# Patient Record
Sex: Male | Born: 1965 | Race: Black or African American | Hispanic: No | Marital: Married | Smoking: Current every day smoker
Health system: Southern US, Community
[De-identification: ages and names within clinical notes are randomized; demographics above are authoritative.]

## PROBLEM LIST (undated history)

## (undated) DIAGNOSIS — M609 Myositis, unspecified: Secondary | ICD-10-CM

## (undated) DIAGNOSIS — M1990 Unspecified osteoarthritis, unspecified site: Secondary | ICD-10-CM

## (undated) DIAGNOSIS — M25552 Pain in left hip: Secondary | ICD-10-CM

## (undated) DIAGNOSIS — J302 Other seasonal allergic rhinitis: Secondary | ICD-10-CM

## (undated) DIAGNOSIS — S29012A Strain of muscle and tendon of back wall of thorax, initial encounter: Secondary | ICD-10-CM

## (undated) DIAGNOSIS — L0390 Cellulitis, unspecified: Secondary | ICD-10-CM

## (undated) DIAGNOSIS — L309 Dermatitis, unspecified: Secondary | ICD-10-CM

## (undated) DIAGNOSIS — M549 Dorsalgia, unspecified: Secondary | ICD-10-CM

## (undated) HISTORY — PX: MULTIPLE TOOTH EXTRACTIONS: SHX2053

## (undated) HISTORY — PX: HIP ASPIRATION LEFT (ARMC HX): HXRAD1711

---

## 2005-01-06 ENCOUNTER — Emergency Department: Payer: Self-pay | Admitting: Emergency Medicine

## 2006-01-12 ENCOUNTER — Emergency Department: Payer: Self-pay | Admitting: Unknown Physician Specialty

## 2006-03-31 ENCOUNTER — Emergency Department: Payer: Self-pay | Admitting: Emergency Medicine

## 2006-03-31 ENCOUNTER — Other Ambulatory Visit: Payer: Self-pay

## 2007-03-16 ENCOUNTER — Emergency Department: Payer: Self-pay | Admitting: Emergency Medicine

## 2008-05-08 ENCOUNTER — Other Ambulatory Visit: Payer: Self-pay

## 2008-05-08 ENCOUNTER — Emergency Department: Payer: Self-pay | Admitting: Emergency Medicine

## 2008-07-30 ENCOUNTER — Emergency Department: Payer: Self-pay | Admitting: Emergency Medicine

## 2010-11-05 ENCOUNTER — Emergency Department: Payer: Self-pay | Admitting: Emergency Medicine

## 2010-11-07 ENCOUNTER — Emergency Department: Payer: Self-pay | Admitting: Emergency Medicine

## 2010-11-14 ENCOUNTER — Ambulatory Visit (HOSPITAL_COMMUNITY)
Admission: RE | Admit: 2010-11-14 | Discharge: 2010-11-14 | Payer: Self-pay | Source: Home / Self Care | Attending: Chiropractic Medicine | Admitting: Chiropractic Medicine

## 2011-10-22 ENCOUNTER — Emergency Department: Payer: Self-pay | Admitting: *Deleted

## 2013-05-11 ENCOUNTER — Emergency Department: Payer: Self-pay | Admitting: Unknown Physician Specialty

## 2014-08-31 ENCOUNTER — Emergency Department: Payer: Self-pay | Admitting: Emergency Medicine

## 2015-02-13 ENCOUNTER — Emergency Department
Admit: 2015-02-13 | Disposition: A | Discharge status: Home / Self Care | Payer: Self-pay | Admitting: Emergency Medicine

## 2015-05-30 ENCOUNTER — Emergency Department
Admission: EM | Admit: 2015-05-30 | Discharge: 2015-05-30 | Disposition: A | Discharge status: Home / Self Care | Payer: Self-pay | Attending: Emergency Medicine | Admitting: Emergency Medicine

## 2015-05-30 ENCOUNTER — Other Ambulatory Visit: Payer: Self-pay

## 2015-05-30 DIAGNOSIS — R531 Weakness: Secondary | ICD-10-CM

## 2015-05-30 DIAGNOSIS — Y9389 Activity, other specified: Secondary | ICD-10-CM | POA: Insufficient documentation

## 2015-05-30 DIAGNOSIS — Z720 Tobacco use: Secondary | ICD-10-CM | POA: Insufficient documentation

## 2015-05-30 DIAGNOSIS — Y990 Civilian activity done for income or pay: Secondary | ICD-10-CM | POA: Insufficient documentation

## 2015-05-30 DIAGNOSIS — M6282 Rhabdomyolysis: Secondary | ICD-10-CM | POA: Insufficient documentation

## 2015-05-30 DIAGNOSIS — W92XXXA Exposure to excessive heat of man-made origin, initial encounter: Secondary | ICD-10-CM | POA: Insufficient documentation

## 2015-05-30 DIAGNOSIS — Y9289 Other specified places as the place of occurrence of the external cause: Secondary | ICD-10-CM | POA: Insufficient documentation

## 2015-05-30 LAB — BASIC METABOLIC PANEL
ANION GAP: 9 (ref 5–15)
BUN: 28 mg/dL — ABNORMAL HIGH (ref 6–20)
CO2: 24 mmol/L (ref 22–32)
CREATININE: 1.11 mg/dL (ref 0.61–1.24)
Calcium: 9.4 mg/dL (ref 8.9–10.3)
Chloride: 103 mmol/L (ref 101–111)
GFR calc Af Amer: >60 mL/min (ref >60)
Glucose, Bld: 84 mg/dL (ref 65–99)
POTASSIUM: 4.6 mmol/L (ref 3.5–5.1)
SODIUM: 136 mmol/L (ref 135–145)

## 2015-05-30 LAB — CBC
HCT: 44 % (ref 40.0–52.0)
HEMOGLOBIN: 14.6 g/dL (ref 13.0–18.0)
MCH: 30.3 pg (ref 26.0–34.0)
MCHC: 33.2 g/dL (ref 32.0–36.0)
MCV: 91.5 fL (ref 80.0–100.0)
Platelets: 282 10*3/uL (ref 150–440)
RBC: 4.81 MIL/uL (ref 4.40–5.90)
RDW: 13.6 % (ref 11.5–14.5)
WBC: 6.9 10*3/uL (ref 3.8–10.6)

## 2015-05-30 LAB — CK: CK TOTAL: 476 U/L — AB (ref 49–397)

## 2015-05-30 LAB — TROPONIN I

## 2015-05-30 MED ORDER — SODIUM CHLORIDE 0.9 % IV BOLUS (SEPSIS)
1000.0000 mL | Freq: Once | INTRAVENOUS | Status: AC
  Administered 2015-05-30: 1000 mL via INTRAVENOUS

## 2015-05-30 NOTE — Discharge Instructions (Signed)
As we have discussed your labs are most consistent with dehydration, and rhabdomyolysis (muscle breakdown). It is very important he drink plenty of fluids over the next several days, rest and do not overexert herself. Follow-up with your primary care doctor in 1-2 days for recheck. Return to the emergency department for any increased weakness, fatigue, decreased urine output, or any other symptom personally concerning to yourself.   Fatigue Fatigue is a feeling of tiredness, lack of energy, lack of motivation, or feeling tired all the time. Having enough rest, good nutrition, and reducing stress will normally reduce fatigue. Consult your caregiver if it persists. The nature of your fatigue will help your caregiver to find out its cause. The treatment is based on the cause.  CAUSES  There are many causes for fatigue. Most of the time, fatigue can be traced to one or more of your habits or routines. Most causes fit into one or more of three general areas. They are: Lifestyle problems  Sleep disturbances.  Overwork.  Physical exertion.  Unhealthy habits.  Poor eating habits or eating disorders.  Alcohol and/or drug use .  Lack of proper nutrition (malnutrition). Psychological problems  Stress and/or anxiety problems.  Depression.  Grief.  Boredom. Medical Problems or Conditions  Anemia.  Pregnancy.  Thyroid gland problems.  Recovery from major surgery.  Continuous pain.  Emphysema or asthma that is not well controlled  Allergic conditions.  Diabetes.  Infections (such as mononucleosis).  Obesity.  Sleep disorders, such as sleep apnea.  Heart failure or other heart-related problems.  Cancer.  Kidney disease.  Liver disease.  Effects of certain medicines such as antihistamines, cough and cold remedies, prescription pain medicines, heart and blood pressure medicines, drugs used for treatment of cancer, and some antidepressants. SYMPTOMS  The symptoms of  fatigue include:   Lack of energy.  Lack of drive (motivation).  Drowsiness.  Feeling of indifference to the surroundings. DIAGNOSIS  The details of how you feel help guide your caregiver in finding out what is causing the fatigue. You will be asked about your present and past health condition. It is important to review all medicines that you take, including prescription and non-prescription items. A thorough exam will be done. You will be questioned about your feelings, habits, and normal lifestyle. Your caregiver may suggest blood tests, urine tests, or other tests to look for common medical causes of fatigue.  TREATMENT  Fatigue is treated by correcting the underlying cause. For example, if you have continuous pain or depression, treating these causes will improve how you feel. Similarly, adjusting the dose of certain medicines will help in reducing fatigue.  HOME CARE INSTRUCTIONS   Try to get the required amount of good sleep every night.  Eat a healthy and nutritious diet, and drink enough water throughout the day.  Practice ways of relaxing (including yoga or meditation).  Exercise regularly.  Make plans to change situations that cause stress. Act on those plans so that stresses decrease over time. Keep your work and personal routine reasonable.  Avoid street drugs and minimize use of alcohol.  Start taking a daily multivitamin after consulting your caregiver. SEEK MEDICAL CARE IF:   You have persistent tiredness, which cannot be accounted for.  You have fever.  You have unintentional weight loss.  You have headaches.  You have disturbed sleep throughout the night.  You are feeling sad.  You have constipation.  You have dry skin.  You have gained weight.  You are taking  any new or different medicines that you suspect are causing fatigue.  You are unable to sleep at night.  You develop any unusual swelling of your legs or other parts of your body. SEEK  IMMEDIATE MEDICAL CARE IF:   You are feeling confused.  Your vision is blurred.  You feel faint or pass out.  You develop severe headache.  You develop severe abdominal, pelvic, or back pain.  You develop chest pain, shortness of breath, or an irregular or fast heartbeat.  You are unable to pass a normal amount of urine.  You develop abnormal bleeding such as bleeding from the rectum or you vomit blood.  You have thoughts about harming yourself or committing suicide.  You are worried that you might harm someone else. MAKE SURE YOU:   Understand these instructions.  Will watch your condition.  Will get help right away if you are not doing well or get worse. Document Released: 08/16/2007 Document Revised: 01/11/2012 Document Reviewed: 02/20/2014 [facility] Patient Information 2015 [place], [place]. This information is not intended to replace advice given to you by your health care provider. Make sure you discuss any questions you have with your health care provider.  Rhabdomyolysis Rhabdomyolysis is the breakdown of muscle fibers due to injury. The injury may come from physical damage to the muscle like an injury but other causes are:  High fever (hyperthermia).  Seizures (convulsions).  Low phosphate levels.  Diseases of metabolism.  Heatstroke.  Drug toxicity.  Over exertion.  Alcoholism.  Muscle is cut off from oxygen (anoxia).  The squeezing of nerves and blood vessels (compartment syndrome). Some drugs which may cause the breakdown of muscle are:  Antibiotics.  Statins.  Alcohol.  Animal toxins. Myoglobin is a substance which helps muscle use oxygen. When the muscle is damaged, the myoglobin is released into the bloodstream. It is filtered out of the bloodstream by the kidneys. Myoglobin may block up the kidneys. This may cause damage, such as kidney failure. It also breaks down into other damaging toxic parts, which also cause kidney failure.  SYMPTOMS     Dark, red, or tea colored urine.  Weakness of affected muscles.  Weight gain from water retention.  Joint aches and pains.  Irregular heart from high potassium in the blood.  Muscle tenderness or aching.  Generalized weakness.  Seizures.  Feeling tired (fatigue). DIAGNOSIS  Your caregiver may find muscle tenderness on exam and suspect the problem. Urine tests and blood work can confirm the problem. TREATMENT   Early and aggressive treatment with large amounts of fluids may help prevent kidney failure.  Water producing medicine (diuretic) may be used to help flush the kidneys.  High potassium and calcium problems (electrolyte) in your blood may need treatment. HOME CARE INSTRUCTIONS  This problem is usually cared for in a hospital. If you are allowed to go home and require dialysis, make sure you keep all appointments for lab work and dialysis. Not doing so could result in death. Document Released: 10/28/2004 Document Revised: 01/11/2012 Document Reviewed: 04/15/2009 [facility] Patient Information 2015 [place], [place]. This information is not intended to replace advice given to you by your health care provider. Make sure you discuss any questions you have with your health care provider.

## 2015-05-30 NOTE — ED Notes (Signed)
Pt states that he was working outside Tuesday, got very hot. C/o fatigue, weakness and cramping at this time. Pt alert and oriented X4, active, cooperative, pt in NAD. RR even and unlabored, color WNL.  Ambulatory.

## 2015-05-30 NOTE — ED Provider Notes (Signed)
[facility] Emergency Department Provider Note  Time seen: 11:37 AM  I have reviewed the triage vital signs and the nursing notes.   HISTORY  Chief Complaint Heat Exposure and Fatigue    HPI Ethan Drake is a 49 y.o. male with no past medical history who presents the emergency department with generalized weakness and muscle cramping. According to the patient he works at the [employer]. He states it is very hot at his work. 3-4 days ago he had a near syncopal episode while at work. Since that time he has felt weak, with occasional muscle cramps. He states his symptoms worsened last night into today so he came to the emergency department for evaluation. Denies any further syncopal or near syncopal episodes. Denies any chest pain at any time. Denies abdominal pain although he did state he felt nauseated at times. Denies vomiting denies diarrhea.     History reviewed. No pertinent past medical history.  There are no active problems to display for this patient.   History reviewed. No pertinent past surgical history.  No current outpatient prescriptions on file.  Allergies Vicodin  No family history on file.  Social History History  Substance Use Topics  . Smoking status: Current Every Day Smoker  . Smokeless tobacco: Not on file  . Alcohol Use: Yes    Review of Systems Constitutional: Negative for fever Cardiovascular: Negative for chest pain. Respiratory: Negative for shortness of breath. Gastrointestinal: Negative for abdominal pain. Positive for nausea. Genitourinary: Negative for dysuria Neurological: Negative for headache 10-point ROS otherwise negative.  ____________________________________________   PHYSICAL EXAM:  VITAL SIGNS: ED Triage Vitals  Enc Vitals Group     BP 05/30/15 1103 136/92 mmHg     Pulse Rate 05/30/15 1054 85     Resp 05/30/15 1054 18     Temp 05/30/15 1054 98.2 F (36.8 C)     Temp Source 05/30/15 1054  Oral     SpO2 05/30/15 1054 100 %     Weight 05/30/15 1054 165 lb (74.844 kg)     Height 05/30/15 1054  (1.676 m)     Head Cir --      Peak Flow --      Pain Score 05/30/15 1056 6     Pain Loc --      Pain Edu? --      Excl. in GC? --     Constitutional: Alert and oriented. Well appearing and in no distress. Eyes: Normal exam ENT   Mouth/Throat: Mucous membranes are moist. Cardiovascular: Normal rate, regular rhythm. No murmur Respiratory: Normal respiratory effort without tachypnea nor retractions. Breath sounds are clear and equal bilaterally. No wheezes/rales/rhonchi. Gastrointestinal: Soft and nontender. No distention.  Musculoskeletal: Nontender with normal range of motion in all extremities. No lower extremity tenderness or edema. Neurologic:  Normal speech and language. No gross focal neurologic deficits Skin:  Skin is warm, dry and intact.  Psychiatric: Mood and affect are normal. Speech and behavior are normal.  ____________________________________________    EKG  EKG reviewed and interpreted by myself shows sinus bradycardia at 57 bpm, narrow QRS, normal axis, normal intervals, nonspecific but no concerning ST changes present.  ____________________________________________   INITIAL IMPRESSION / ASSESSMENT AND PLAN / ED COURSE  Pertinent labs & imaging results that were available during my care of the patient were reviewed by me and considered in my medical decision making (see chart for details).  Patient presents for symptoms of heat exhaustion per patient.  He is having generalized weakness and muscle cramping. We will check labs, IV hydrate, and check an EKG. Overall the patient appears very well with minimal symptoms currently. Normal exam.   Labs show an elevated creatinine kinase in the 400s. Labs otherwise are normal. I discussed with the patient the need to increase fluids, and follow up with his primary care doctor in 1-2 days for recheck. The  patient is agreeable.  ____________________________________________   FINAL CLINICAL IMPRESSION(S) / ED DIAGNOSES  Generalized weakness Rhabdomyolysis   Minna Antis, MD 05/30/15 1501

## 2015-08-05 ENCOUNTER — Emergency Department (HOSPITAL_COMMUNITY)
Admission: EM | Admit: 2015-08-05 | Discharge: 2015-08-06 | Disposition: A | Discharge status: Home / Self Care | Payer: Self-pay | Attending: Emergency Medicine | Admitting: Emergency Medicine

## 2015-08-05 ENCOUNTER — Encounter (HOSPITAL_COMMUNITY): Payer: Self-pay | Admitting: Emergency Medicine

## 2015-08-05 DIAGNOSIS — S299XXA Unspecified injury of thorax, initial encounter: Secondary | ICD-10-CM | POA: Insufficient documentation

## 2015-08-05 DIAGNOSIS — Y990 Civilian activity done for income or pay: Secondary | ICD-10-CM | POA: Insufficient documentation

## 2015-08-05 DIAGNOSIS — X58XXXA Exposure to other specified factors, initial encounter: Secondary | ICD-10-CM | POA: Insufficient documentation

## 2015-08-05 DIAGNOSIS — Y9289 Other specified places as the place of occurrence of the external cause: Secondary | ICD-10-CM | POA: Insufficient documentation

## 2015-08-05 DIAGNOSIS — Y9389 Activity, other specified: Secondary | ICD-10-CM | POA: Insufficient documentation

## 2015-08-05 DIAGNOSIS — Y999 Unspecified external cause status: Secondary | ICD-10-CM | POA: Insufficient documentation

## 2015-08-05 DIAGNOSIS — M62838 Other muscle spasm: Secondary | ICD-10-CM

## 2015-08-05 DIAGNOSIS — Z720 Tobacco use: Secondary | ICD-10-CM | POA: Insufficient documentation

## 2015-08-05 NOTE — ED Notes (Signed)
Pt. reports injury to left upper back while at work this evening , pt. stated he pulled a heavy cart and felt pain at left upper back , pain increases with movement and changing positions , respirations unlabored .

## 2015-08-06 ENCOUNTER — Emergency Department (HOSPITAL_COMMUNITY): Payer: Self-pay

## 2015-08-06 MED ORDER — CYCLOBENZAPRINE HCL 10 MG PO TABS: 10.0000 mg | ORAL_TABLET | Freq: Two times a day (BID) | ORAL | Status: DC | PRN

## 2015-08-06 MED ORDER — OXYCODONE-ACETAMINOPHEN 5-325 MG PO TABS: 2.0000 | ORAL_TABLET | ORAL | Status: DC | PRN

## 2015-08-06 MED ORDER — OXYCODONE-ACETAMINOPHEN 5-325 MG PO TABS
1.0000 | ORAL_TABLET | Freq: Once | ORAL | Status: AC
  Administered 2015-08-06: 1 via ORAL
  Filled 2015-08-06: qty 1

## 2015-08-06 NOTE — ED Provider Notes (Signed)
CSN: [id number]     Arrival date & time 08/05/15  2257 History   First MD Initiated Contact with Patient 08/06/15 0003     Chief Complaint  Patient presents with  . Back Injury     (Consider location/radiation/quality/duration/timing/severity/associated sxs/prior Treatment) HPI Comments: Sion Reinders is a 49 y.o M with no significant pmhx who presents to the Ed today c/o left shoulder and back injury at work today. Pt states that he was doing heavy lifting at work when he felt sudden onset left shoulder and back pain. Pt states that now it is painful to lift his shoulder in any direction. Patient also states that when he takes a deep breath he feels pain in the same area. Denies trauma. Denies neck stiffness, numbness, weakness, tingling. Pain is worsened with movement of shoulder. Pt has not taken any pan medications.   The history is provided by the patient.    History reviewed. No pertinent past medical history. History reviewed. No pertinent past surgical history. No family history on file. Social History  Substance Use Topics  . Smoking status: Current Every Day Smoker  . Smokeless tobacco: None  . Alcohol Use: Yes    Review of Systems    Allergies  Vicodin  Home Medications   Prior to Admission medications   Medication Sig Start Date End Date Taking? Authorizing Provider  cyclobenzaprine (FLEXERIL) 10 MG tablet Take 1 tablet (10 mg total) by mouth 2 (two) times daily as needed for muscle spasms. 08/06/15   Samantha Tripp Dowless, PA-C  oxyCODONE-acetaminophen (PERCOCET/ROXICET) 5-325 MG tablet Take 2 tablets by mouth every 4 (four) hours as needed for severe pain. 08/06/15   Samantha Tripp Dowless, PA-C   BP 127/74 mmHg  Pulse 95  Temp(Src) 98.2 F (36.8 C) (Oral)  Resp 16  Ht  (1.676 m)  Wt 172 lb (78.019 kg)  BMI 27.77 kg/m2  SpO2 99% Physical Exam  Constitutional: He is oriented to person, place, and time. He appears well-developed and  well-nourished. No distress.  HENT:  Head: Normocephalic and atraumatic.  Eyes: Conjunctivae and EOM are normal. Pupils are equal, round, and reactive to light. Right eye exhibits no discharge. Left eye exhibits no discharge. No scleral icterus.  Cardiovascular: Normal rate, regular rhythm, normal heart sounds and intact distal pulses.  Exam reveals no gallop and no friction rub.   No murmur heard. Pulmonary/Chest: Effort normal and breath sounds normal. No respiratory distress. He has no wheezes. He has no rales. He exhibits no tenderness.  Abdominal: Soft. He exhibits no distension and no mass. There is no tenderness. There is no rebound and no guarding.  Musculoskeletal: Normal range of motion. He exhibits no edema.  TTP of left paraspinal muscle and muscles surounding left shoulder. No bony tenderness. Patient unable to abduct left shoulder limited by pain.   Neurological: He is alert and oriented to person, place, and time.  Strength 5/5 throughout. No sensory deficits.    Skin: Skin is warm and dry. No rash noted. He is not diaphoretic. No erythema. No pallor.  Psychiatric: He has a normal mood and affect. His behavior is normal.  Nursing note and vitals reviewed.   ED Course  Procedures (including critical care time) Labs Review Labs Reviewed - No data to display  Imaging Review Dg Chest 2 View  08/06/2015   CLINICAL DATA:  Left shoulder injury, with left posterior shoulder pain. Initial encounter.  EXAM: CHEST  2 VIEW  COMPARISON:  Chest radiograph performed  02/13/2015  FINDINGS: There is mild elevation of the left hemidiaphragm. The lungs remain grossly clear. No focal consolidation, pleural effusion or pneumothorax is seen.  The heart remains normal in size. No acute osseous abnormalities are identified.  IMPRESSION: Mild elevation of the left hemidiaphragm. Lungs remain grossly clear. No displaced rib fracture seen.   Electronically Signed   By: Roanna Raider M.D.   On:  08/06/2015 01:40   Dg Shoulder Left  08/06/2015   CLINICAL DATA:  Status post left shoulder injury, with left posterior shoulder pain. Initial encounter.  EXAM: LEFT SHOULDER - 2+ VIEW  COMPARISON:  Left shoulder MRI performed 11/14/2010  FINDINGS: There is no evidence of fracture or dislocation. The left humeral head is seated within the glenoid fossa. The acromioclavicular joint is unremarkable in appearance. No significant soft tissue abnormalities are seen. The visualized portions of the left lung are clear.  IMPRESSION: No evidence of fracture or dislocation.   Electronically Signed   By: Roanna Raider M.D.   On: 08/06/2015 01:39   I have personally reviewed and evaluated these images and lab results as part of my medical decision-making.   EKG Interpretation None      MDM   Final diagnoses:  Muscle spasm of left shoulder area    Patient seen when pain and left shoulder area after heavy lifting at work. Patient also states he feels pain with inspiration. Possible pleurisy. We will x-ray chest and left shoulder. No acute fracture identified or cardiopulmonary disease, pneumothorax. Tenderness to palpation of the paraspinal muscles. Suspect acute muscle spasm. Will give pain medication and muscle relaxers. Recommend follow-up with primary care orthopedic if symptoms do not improve within 1 week. Apply heat to affected area.  Return precautions outlined in patient discharge instructions.      Lester Kinsman [place], PA-C 08/06/15 0205  Benjiman Core, MD 08/07/15 0030

## 2015-08-06 NOTE — Discharge Instructions (Signed)
Heat Therapy Heat therapy can help make painful, stiff muscles and joints feel better. Do not use heat on new injuries. Wait at least 48 hours after an injury to use heat. Do not use heat when you have aches or pains right after an activity. If you still have pain 3 hours after stopping the activity, then you may use heat. HOME CARE Wet heat pack  Soak a clean towel in warm water. Squeeze out the extra water.  Put the warm, wet towel in a plastic bag.  Place a thin, dry towel between your skin and the bag.  Put the heat pack on the area for 5 minutes, and check your skin. Your skin may be pink, but it should not be red.  Leave the heat pack on the area for 15 to 30 minutes.  Repeat this every 2 to 4 hours while awake. Do not use heat while you are sleeping. Warm water bath  Fill a tub with warm water.  Place the affected body part in the tub.  Soak the area for 20 to 40 minutes.  Repeat as needed. Hot water bottle  Fill the water bottle half full with hot water.  Press out the extra air. Close the cap tightly.  Place a dry towel between your skin and the bottle.  Put the bottle on the area for 5 minutes, and check your skin. Your skin may be pink, but it should not be red.  Leave the bottle on the area for 15 to 30 minutes.  Repeat this every 2 to 4 hours while awake. Electric heating pad  Place a dry towel between your skin and the heating pad.  Set the heating pad on low heat.  Put the heating pad on the area for 10 minutes, and check your skin. Your skin may be pink, but it should not be red.  Leave the heating pad on the area for 20 to 40 minutes.  Repeat this every 2 to 4 hours while awake.  Do not lie on the heating pad.  Do not fall asleep while using the heating pad.  Do not use the heating pad near water. GET HELP RIGHT AWAY IF:  You get blisters or red skin.  Your skin is puffy (swollen), or you lose feeling (numbness) in the affected area.  You  have any new problems.  Your problems are getting worse.  You have any questions or concerns. If you have any problems, stop using heat therapy until you see your doctor. MAKE SURE YOU:  Understand these instructions.  Will watch your condition.  Will get help right away if you are not doing well or get worse. Document Released: 01/11/2012 Document Reviewed: 12/12/2013 [facility] Patient Information 2015 [place], [place]. This information is not intended to replace advice given to you by your health care provider. Make sure you discuss any questions you have with your health care provider.  Back Pain, Adult Back pain is very common. The pain often gets better over time. The cause of back pain is usually not dangerous. Most people can learn to manage their back pain on their own.  HOME CARE   Stay active. Start with short walks on flat ground if you can. Try to walk farther each day.  Do not sit, drive, or stand in one place for more than 30 minutes. Do not stay in bed.  Do not avoid exercise or work. Activity can help your back heal faster.  Be careful when you bend  or lift an object. Bend at your knees, keep the object close to you, and do not twist.  Sleep on a firm mattress. Lie on your side, and bend your knees. If you lie on your back, put a pillow under your knees.  Only take medicines as told by your doctor.  Put ice on the injured area.  Put ice in a plastic bag.  Place a towel between your skin and the bag.  Leave the ice on for 15-20 minutes, 03-04 times a day for the first 2 to 3 days. After that, you can switch between ice and heat packs.  Ask your doctor about back exercises or massage.  Avoid feeling anxious or stressed. Find good ways to deal with stress, such as exercise. GET HELP RIGHT AWAY IF:   Your pain does not go away with rest or medicine.  Your pain does not go away in 1 week.  You have new problems.  You do not feel well.  The pain spreads  into your legs.  You cannot control when you poop (bowel movement) or pee (urinate).  Your arms or legs feel weak or lose feeling (numbness).  You feel sick to your stomach (nauseous) or throw up (vomit).  You have belly (abdominal) pain.  You feel like you may pass out (faint). MAKE SURE YOU:   Understand these instructions.  Will watch your condition.  Will get help right away if you are not doing well or get worse. Document Released: 04/06/2008 Document Revised: 01/11/2012 Document Reviewed: 02/20/2014 [facility] Patient Information 2015 [place], [place]. This information is not intended to replace advice given to you by your health care provider. Make sure you discuss any questions you have with your health care provider.  Muscle Cramps and Spasms Muscle cramps and spasms occur when a muscle or muscles tighten and you have no control over this tightening (involuntary muscle contraction). They are a common problem and can develop in any muscle. The most common place is in the calf muscles of the leg. Both muscle cramps and muscle spasms are involuntary muscle contractions, but they also have differences:   Muscle cramps are sporadic and painful. They may last a few seconds to a quarter of an hour. Muscle cramps are often more forceful and last longer than muscle spasms.  Muscle spasms may or may not be painful. They may also last just a few seconds or much longer. CAUSES  It is uncommon for cramps or spasms to be due to a serious underlying problem. In many cases, the cause of cramps or spasms is unknown. Some common causes are:   Overexertion.   Overuse from repetitive motions (doing the same thing over and over).   Remaining in a certain position for a long period of time.   Improper preparation, form, or technique while performing a sport or activity.   Dehydration.   Injury.   Side effects of some medicines.   Abnormally low levels of the salts and ions in your  blood (electrolytes), especially potassium and calcium. This could happen if you are taking water pills (diuretics) or you are pregnant.  Some underlying medical problems can make it more likely to develop cramps or spasms. These include, but are not limited to:   Diabetes.   Parkinson disease.   Hormone disorders, such as thyroid problems.   Alcohol abuse.   Diseases specific to muscles, joints, and bones.   Blood vessel disease where not enough blood is getting to the muscles.  HOME CARE INSTRUCTIONS   Stay well hydrated. Drink enough water and fluids to keep your urine clear or pale yellow.  It may be helpful to massage, stretch, and relax the affected muscle.  For tight or tense muscles, use a warm towel, heating pad, or hot shower water directed to the affected area.  If you are sore or have pain after a cramp or spasm, applying ice to the affected area may relieve discomfort.  Put ice in a plastic bag.  Place a towel between your skin and the bag.  Leave the ice on for 15-20 minutes, 03-04 times a day.  Medicines used to treat a known cause of cramps or spasms may help reduce their frequency or severity. Only take over-the-counter or prescription medicines as directed by your caregiver. SEEK MEDICAL CARE IF:  Your cramps or spasms get more severe, more frequent, or do not improve over time.  MAKE SURE YOU:   Understand these instructions.  Will watch your condition.  Will get help right away if you are not doing well or get worse. Document Released: 04/10/2002 Document Revised: 02/13/2013 Document Reviewed: 10/05/2012 [facility] Patient Information 2015 [place], [place]. This information is not intended to replace advice given to you by your health care provider. Make sure you discuss any questions you have with your health care provider.  Apply heat to affected area. Avoid heavy lifting and exercising for the next several days. Return to the emergency department  if you experience worsening of her symptoms, difficult to breathing, chest pain. Follow-up with orthopedic if symptoms do not improve over the next week.

## 2016-01-08 ENCOUNTER — Encounter (HOSPITAL_COMMUNITY): Payer: Self-pay | Admitting: Family Medicine

## 2016-01-08 ENCOUNTER — Emergency Department (HOSPITAL_COMMUNITY): Payer: Self-pay

## 2016-01-08 ENCOUNTER — Emergency Department (HOSPITAL_COMMUNITY)
Admission: EM | Admit: 2016-01-08 | Discharge: 2016-01-08 | Disposition: A | Discharge status: Home / Self Care | Payer: Self-pay | Attending: Emergency Medicine | Admitting: Emergency Medicine

## 2016-01-08 DIAGNOSIS — F17210 Nicotine dependence, cigarettes, uncomplicated: Secondary | ICD-10-CM | POA: Insufficient documentation

## 2016-01-08 DIAGNOSIS — M25512 Pain in left shoulder: Secondary | ICD-10-CM | POA: Insufficient documentation

## 2016-01-08 LAB — CBC
HCT: 40.8 % (ref 39.0–52.0)
HEMOGLOBIN: 13.7 g/dL (ref 13.0–17.0)
MCH: 30.2 pg (ref 26.0–34.0)
MCHC: 33.6 g/dL (ref 30.0–36.0)
MCV: 90.1 fL (ref 78.0–100.0)
PLATELETS: 236 10*3/uL (ref 150–400)
RBC: 4.53 MIL/uL (ref 4.22–5.81)
RDW: 13.3 % (ref 11.5–15.5)
WBC: 6.4 10*3/uL (ref 4.0–10.5)

## 2016-01-08 LAB — BASIC METABOLIC PANEL
ANION GAP: 8 (ref 5–15)
BUN: 13 mg/dL (ref 6–20)
CALCIUM: 9.6 mg/dL (ref 8.9–10.3)
CO2: 27 mmol/L (ref 22–32)
CREATININE: 1.34 mg/dL — AB (ref 0.61–1.24)
Chloride: 103 mmol/L (ref 101–111)
GFR, EST NON AFRICAN AMERICAN: 60 mL/min — AB (ref >60)
Glucose, Bld: 115 mg/dL — ABNORMAL HIGH (ref 65–99)
Potassium: 4 mmol/L (ref 3.5–5.1)
SODIUM: 138 mmol/L (ref 135–145)

## 2016-01-08 LAB — I-STAT TROPONIN, ED: Troponin i, poc: 0 ng/mL (ref 0.00–0.08)

## 2016-01-08 NOTE — ED Notes (Signed)
Pt LWBS 

## 2016-01-08 NOTE — ED Notes (Signed)
Pt here for left shoulder pain radiating into neck and and down left arm. sts has been going on intermittently for months. Denies chest pain. sts hurts worse with certain movements. sts numbness in fingers.

## 2016-07-31 ENCOUNTER — Emergency Department
Admission: EM | Admit: 2016-07-31 | Discharge: 2016-07-31 | Disposition: A | Discharge status: Home / Self Care | Payer: Self-pay | Attending: Student in an Organized Health Care Education/Training Program | Admitting: Student in an Organized Health Care Education/Training Program

## 2016-07-31 ENCOUNTER — Emergency Department: Payer: Self-pay

## 2016-07-31 DIAGNOSIS — S46912A Strain of unspecified muscle, fascia and tendon at shoulder and upper arm level, left arm, initial encounter: Secondary | ICD-10-CM | POA: Insufficient documentation

## 2016-07-31 DIAGNOSIS — F17210 Nicotine dependence, cigarettes, uncomplicated: Secondary | ICD-10-CM | POA: Insufficient documentation

## 2016-07-31 DIAGNOSIS — X58XXXA Exposure to other specified factors, initial encounter: Secondary | ICD-10-CM | POA: Insufficient documentation

## 2016-07-31 DIAGNOSIS — Y999 Unspecified external cause status: Secondary | ICD-10-CM | POA: Insufficient documentation

## 2016-07-31 DIAGNOSIS — Y939 Activity, unspecified: Secondary | ICD-10-CM | POA: Insufficient documentation

## 2016-07-31 DIAGNOSIS — Y929 Unspecified place or not applicable: Secondary | ICD-10-CM | POA: Insufficient documentation

## 2016-07-31 MED ORDER — KETOROLAC TROMETHAMINE 10 MG PO TABS: 10.0000 mg | ORAL_TABLET | Freq: Three times a day (TID) | ORAL | 0 refills | Status: DC

## 2016-07-31 MED ORDER — CYCLOBENZAPRINE HCL 10 MG PO TABS
5.0000 mg | ORAL_TABLET | Freq: Once | ORAL | Status: AC
  Administered 2016-07-31: 5 mg via ORAL
  Filled 2016-07-31: qty 1

## 2016-07-31 MED ORDER — CYCLOBENZAPRINE HCL 5 MG PO TABS: 5.0000 mg | ORAL_TABLET | Freq: Three times a day (TID) | ORAL | 0 refills | Status: DC | PRN

## 2016-07-31 MED ORDER — KETOROLAC TROMETHAMINE 60 MG/2ML IM SOLN
30.0000 mg | Freq: Once | INTRAMUSCULAR | Status: AC
  Administered 2016-07-31: 30 mg via INTRAMUSCULAR
  Filled 2016-07-31: qty 2

## 2016-07-31 NOTE — Discharge Instructions (Signed)
Your exam reveals some muscle strain and pain to the rotator cuff. Your x-ray is negative for any fracture, arthritis, or dislocation. You also have some chronic biceps muscle weakness and atrophy. You should take the prescription meds as directed. Follow-up with Dr. Hyacinth MeekerMiller for continued symptoms.
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
## 2016-07-31 NOTE — ED Notes (Signed)
Reviewed d/c instructions, follow-up care and prescriptions with pt. Pt verbalized understanding 
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
## 2016-08-01 ENCOUNTER — Encounter: Payer: Self-pay | Admitting: Physician Assistant

## 2016-08-03 ENCOUNTER — Encounter (HOSPITAL_COMMUNITY): Payer: Self-pay | Admitting: Nurse Practitioner

## 2016-08-03 ENCOUNTER — Emergency Department (HOSPITAL_COMMUNITY)
Admission: EM | Admit: 2016-08-03 | Discharge: 2016-08-03 | Disposition: A | Discharge status: Home / Self Care | Payer: Self-pay | Attending: Emergency Medicine | Admitting: Emergency Medicine

## 2016-08-03 DIAGNOSIS — F17210 Nicotine dependence, cigarettes, uncomplicated: Secondary | ICD-10-CM | POA: Insufficient documentation

## 2016-08-03 DIAGNOSIS — M5417 Radiculopathy, lumbosacral region: Secondary | ICD-10-CM

## 2016-08-03 DIAGNOSIS — X509XXA Other and unspecified overexertion or strenuous movements or postures, initial encounter: Secondary | ICD-10-CM | POA: Insufficient documentation

## 2016-08-03 DIAGNOSIS — M5416 Radiculopathy, lumbar region: Secondary | ICD-10-CM | POA: Insufficient documentation

## 2016-08-03 DIAGNOSIS — M25552 Pain in left hip: Secondary | ICD-10-CM | POA: Insufficient documentation

## 2016-08-03 DIAGNOSIS — S39012A Strain of muscle, fascia and tendon of lower back, initial encounter: Secondary | ICD-10-CM | POA: Insufficient documentation

## 2016-08-03 DIAGNOSIS — Y990 Civilian activity done for income or pay: Secondary | ICD-10-CM | POA: Insufficient documentation

## 2016-08-03 DIAGNOSIS — Y929 Unspecified place or not applicable: Secondary | ICD-10-CM | POA: Insufficient documentation

## 2016-08-03 DIAGNOSIS — Y939 Activity, unspecified: Secondary | ICD-10-CM | POA: Insufficient documentation

## 2016-08-03 MED ORDER — METHOCARBAMOL 500 MG PO TABS
1000.0000 mg | ORAL_TABLET | Freq: Once | ORAL | Status: AC
  Administered 2016-08-03: 1000 mg via ORAL
  Filled 2016-08-03: qty 2

## 2016-08-03 MED ORDER — OXYCODONE-ACETAMINOPHEN 5-325 MG PO TABS
1.0000 | ORAL_TABLET | Freq: Once | ORAL | Status: AC
  Administered 2016-08-03: 1 via ORAL
  Filled 2016-08-03: qty 1

## 2016-08-03 MED ORDER — OXYCODONE-ACETAMINOPHEN 5-325 MG PO TABS: 1.0000 | ORAL_TABLET | Freq: Four times a day (QID) | ORAL | 0 refills | Status: DC | PRN
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
## 2016-08-03 NOTE — Discharge Instructions (Signed)
Please read and follow all provided instructions.  Your diagnoses today include:  1. Strain of lumbar region, initial encounter   2. Lumbosacral radiculopathy     Tests performed today include:  Vital signs - see below for your results today  Medications prescribed:   Percocet (oxycodone/acetaminophen) - narcotic pain medication  DO NOT drive or perform any activities that require you to be awake and alert because this medicine can make you drowsy. BE VERY CAREFUL not to take multiple medicines containing Tylenol (also called acetaminophen). Doing so can lead to an overdose which can damage your liver and cause liver failure and possibly death.  Take any prescribed medications only as directed.  Home care instructions:   Follow any educational materials contained in this packet  Please rest, use ice or heat on your back for the next several days  Do not lift, push, pull anything more than 10 pounds for the next week  Follow-up instructions: Please follow-up with your primary care provider in the next 1 week for further evaluation of your symptoms.   Return instructions:  SEEK IMMEDIATE MEDICAL ATTENTION IF YOU HAVE:  New numbness, tingling, weakness, or problem with the use of your arms or legs  Severe back pain not relieved with medications  Loss control of your bowels or bladder  Increasing pain in any areas of the body (such as chest or abdominal pain)  Shortness of breath, dizziness, or fainting.   Worsening nausea (feeling sick to your stomach), vomiting, fever, or sweats  Any other emergent concerns regarding your health   Additional Information:  Your vital signs today were: BP 125/96    Pulse 77    Temp 98.2 F (36.8 C) (Oral)    Resp 20    SpO2 100%  If your blood pressure (BP) was elevated above 135/85 this visit, please have this repeated by your doctor within one month. --------------

## 2016-08-03 NOTE — ED Notes (Signed)
Declined W/C at D/C and was escorted to lobby by RN. 

## 2016-08-03 NOTE — ED Triage Notes (Signed)
Pt presents with c/o L hip pain radiating down L leg. 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 He describes the pain as a tight pulling. He has a history of chronic back pain, but denies any hip or leg pain. He did not try anything at home for the pain. He is alert and breathing easily

## 2017-02-21 ENCOUNTER — Emergency Department (HOSPITAL_COMMUNITY)
Admission: EM | Admit: 2017-02-21 | Discharge: 2017-02-21 | Disposition: A | Discharge status: Home / Self Care | Payer: Managed Care, Other (non HMO) | Attending: Emergency Medicine | Admitting: Emergency Medicine

## 2017-02-21 ENCOUNTER — Emergency Department (HOSPITAL_COMMUNITY): Payer: Managed Care, Other (non HMO)

## 2017-02-21 DIAGNOSIS — F17210 Nicotine dependence, cigarettes, uncomplicated: Secondary | ICD-10-CM | POA: Insufficient documentation

## 2017-02-21 DIAGNOSIS — M5001 Cervical disc disorder with myelopathy,  high cervical region: Secondary | ICD-10-CM | POA: Diagnosis not present

## 2017-02-21 DIAGNOSIS — G959 Disease of spinal cord, unspecified: Secondary | ICD-10-CM

## 2017-02-21 DIAGNOSIS — M25512 Pain in left shoulder: Secondary | ICD-10-CM | POA: Diagnosis present

## 2017-02-21 DIAGNOSIS — Z79899 Other long term (current) drug therapy: Secondary | ICD-10-CM | POA: Diagnosis not present

## 2017-02-21 DIAGNOSIS — M6250 Muscle wasting and atrophy, not elsewhere classified, unspecified site: Secondary | ICD-10-CM | POA: Diagnosis not present

## 2017-02-21 DIAGNOSIS — M6258 Muscle wasting and atrophy, not elsewhere classified, other site: Secondary | ICD-10-CM

## 2017-02-21 MED ORDER — ONDANSETRON HCL 4 MG/2ML IJ SOLN
4.0000 mg | Freq: Once | INTRAMUSCULAR | Status: AC
  Administered 2017-02-21: 4 mg via INTRAVENOUS
  Filled 2017-02-21: qty 2

## 2017-02-21 MED ORDER — HYDROMORPHONE HCL 1 MG/ML IJ SOLN
1.0000 mg | Freq: Once | INTRAMUSCULAR | Status: AC
  Administered 2017-02-21: 1 mg via INTRAVENOUS
  Filled 2017-02-21: qty 1

## 2017-02-21 MED ORDER — OXYCODONE-ACETAMINOPHEN 5-325 MG PO TABS: 1.0000 | ORAL_TABLET | Freq: Four times a day (QID) | ORAL | 0 refills | Status: DC | PRN

## 2017-02-21 MED ORDER — LORAZEPAM 1 MG PO TABS
1.0000 mg | ORAL_TABLET | Freq: Once | ORAL | Status: AC
  Administered 2017-02-21: 1 mg via ORAL
  Filled 2017-02-21: qty 1

## 2017-02-21 MED ORDER — MELOXICAM 15 MG PO TABS: 15.0000 mg | ORAL_TABLET | Freq: Every day | ORAL | 0 refills | Status: DC

## 2017-02-21 MED ORDER — GADOBENATE DIMEGLUMINE 529 MG/ML IV SOLN
14.0000 mL | Freq: Once | INTRAVENOUS | Status: AC | PRN
  Administered 2017-02-21: 14 mL via INTRAVENOUS

## 2017-02-21 MED ORDER — OXYCODONE-ACETAMINOPHEN 5-325 MG PO TABS
2.0000 | ORAL_TABLET | Freq: Once | ORAL | Status: AC
Start: 2017-02-21 — End: 2017-02-21
  Administered 2017-02-21: 2 via ORAL
  Filled 2017-02-21: qty 2

## 2017-02-21 MED ORDER — NICOTINE 21 MG/24HR TD PT24
21.0000 mg | MEDICATED_PATCH | Freq: Every day | TRANSDERMAL | Status: DC
  Administered 2017-02-21: 21 mg via TRANSDERMAL
  Filled 2017-02-21: qty 1

## 2017-02-21 NOTE — ED Notes (Signed)
Patient transported to MRI 
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
## 2017-02-21 NOTE — Discharge Instructions (Signed)
Contact a health care provider if: °Your condition does not improve with treatment. °Get help right away if: °Your pain gets much worse and cannot be controlled with medicines. °You have weakness or numbness in your hand, arm, face, or leg. °You have a high fever. °You have a stiff, rigid neck. °You lose control of your bowels or your bladder (have incontinence). °You have trouble with walking, balance, or speaking. °

## 2017-02-21 NOTE — ED Triage Notes (Signed)
Pt  Here from home with c/o left shoulder pain that started this morning . Pt has an old injury to that shoulder , no chest pain hurts worse with movement

## 2017-03-01 ENCOUNTER — Other Ambulatory Visit: Payer: Self-pay | Admitting: Neurosurgery

## 2017-03-15 ENCOUNTER — Encounter (HOSPITAL_COMMUNITY)
Admission: RE | Admit: 2017-03-15 | Discharge: 2017-03-15 | Disposition: A | Discharge status: Home / Self Care | Payer: Managed Care, Other (non HMO) | Source: Ambulatory Visit | Attending: Neurosurgery | Admitting: Neurosurgery

## 2017-03-15 ENCOUNTER — Encounter (HOSPITAL_COMMUNITY): Payer: Self-pay

## 2017-03-15 HISTORY — DX: Pain in left hip: M25.552

## 2017-03-15 HISTORY — DX: Dermatitis, unspecified: L30.9

## 2017-03-15 HISTORY — DX: Unspecified osteoarthritis, unspecified site: M19.90

## 2017-03-15 HISTORY — DX: Cellulitis, unspecified: L03.90

## 2017-03-15 HISTORY — DX: Dorsalgia, unspecified: M54.9

## 2017-03-15 HISTORY — DX: Myositis, unspecified: M60.9

## 2017-03-15 HISTORY — DX: Strain of muscle and tendon of back wall of thorax, initial encounter: S29.012A

## 2017-03-15 HISTORY — DX: Other seasonal allergic rhinitis: J30.2

## 2017-03-15 LAB — TYPE AND SCREEN
ABO/RH(D): O POS
ANTIBODY SCREEN: NEGATIVE

## 2017-03-15 LAB — SURGICAL PCR SCREEN
MRSA, PCR: NEGATIVE
STAPHYLOCOCCUS AUREUS: POSITIVE — AB

## 2017-03-15 LAB — COMPREHENSIVE METABOLIC PANEL
ALBUMIN: 4.1 g/dL (ref 3.5–5.0)
ALK PHOS: 63 U/L (ref 38–126)
ALT: 33 U/L (ref 17–63)
AST: 47 U/L — ABNORMAL HIGH (ref 15–41)
Anion gap: 7 (ref 5–15)
BILIRUBIN TOTAL: 0.3 mg/dL (ref 0.3–1.2)
BUN: 15 mg/dL (ref 6–20)
CALCIUM: 9.3 mg/dL (ref 8.9–10.3)
CO2: 23 mmol/L (ref 22–32)
CREATININE: 0.99 mg/dL (ref 0.61–1.24)
Chloride: 109 mmol/L (ref 101–111)
GFR calc Af Amer: >60 mL/min (ref >60)
GFR calc non Af Amer: >60 mL/min (ref >60)
GLUCOSE: 77 mg/dL (ref 65–99)
Potassium: 4.1 mmol/L (ref 3.5–5.1)
SODIUM: 139 mmol/L (ref 135–145)
TOTAL PROTEIN: 7.6 g/dL (ref 6.5–8.1)

## 2017-03-15 LAB — CBC
HCT: 41.5 % (ref 39.0–52.0)
HEMOGLOBIN: 13.7 g/dL (ref 13.0–17.0)
MCH: 29.4 pg (ref 26.0–34.0)
MCHC: 33 g/dL (ref 30.0–36.0)
MCV: 89.1 fL (ref 78.0–100.0)
Platelets: 340 10*3/uL (ref 150–400)
RBC: 4.66 MIL/uL (ref 4.22–5.81)
RDW: 14.1 % (ref 11.5–15.5)
WBC: 8.7 10*3/uL (ref 4.0–10.5)

## 2017-03-15 LAB — ABO/RH: ABO/RH(D): O POS

## 2017-03-15 MED ORDER — CHLORHEXIDINE GLUCONATE CLOTH 2 % EX PADS: 6.0000 | MEDICATED_PAD | Freq: Once | CUTANEOUS | Status: DC
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
## 2017-03-15 NOTE — Pre-Procedure Instructions (Addendum)
Ethan IslamKenneth Drake  03/15/2017      [id number] - [place], [place] - [address] [place] [id number] Phone: [phone] Fax: [phone]    Your procedure is scheduled on Mar 16, 2017.  [facility] at 10:30 A.M.  Call this number if you have problems the morning of surgery:  [phone]   Remember:  Do not eat food or drink liquids after midnight.  Take these medicines the morning of surgery with A SIP OF WATER Acetaminophen (Tylenol) or Oxycodone-Acetaminophen (Percocet) if needed.  STOP taking anti-inflammatories, Goody's BC powders, Herbal Medications, Fish Oil, Vitamins, Advil/Motrin, Naproxen, Ibuprofen, Aleve, Mobic/Meloxicam starting today.    Do not wear jewelry, make-up or nail polish.  Do not wear lotions, powders, or deoderant.  Do not shave 48 hours prior to surgery.  Men may shave face and neck.  Do not bring valuables to the hospital.   [facility] is not responsible for any belongings or valuables.  Contacts, dentures or bridgework may not be worn into surgery.  Leave your suitcase in the car.  After surgery it may be brought to your room.  For patients admitted to the hospital, discharge time will be determined by your treatment team.    Special instructions:   [place]- Preparing For Surgery  Before surgery, you can play an important role. Because skin is not sterile, your skin needs to be as free of germs as possible. You can reduce the number of germs on your skin by washing with CHG (chlorahexidine gluconate) Soap before surgery.  CHG is an antiseptic cleaner which kills germs and bonds with the skin to continue killing germs even after washing.  Please do not use if you have an allergy to CHG or antibacterial soaps. If your skin becomes reddened/irritated stop using the CHG.  Do not shave (including legs and underarms) for at least 48 hours prior to first CHG shower. It is OK to shave  your face.  Please follow these instructions carefully.   1. Shower the NIGHT BEFORE SURGERY and the MORNING OF SURGERY with CHG.   2. If you chose to wash your hair, wash your hair first as usual with your normal shampoo.  3. After you shampoo, rinse your hair and body thoroughly to remove the shampoo.  4. Use CHG as you would any other liquid soap. You can apply CHG directly to the skin and wash gently with a scrungie or a clean washcloth.   5. Apply the CHG Soap to your body ONLY FROM THE NECK DOWN.  Do not use on open wounds or open sores. Avoid contact with your eyes, ears, mouth and genitals (private parts). Wash genitals (private parts) with your normal soap.  6. Wash thoroughly, paying special attention to the area where your surgery will be performed.  7. Thoroughly rinse your body with warm water from the neck down.  8. DO NOT shower/wash with your normal soap after using and rinsing off the CHG Soap.  9. Pat yourself dry with a CLEAN TOWEL.   10. Wear CLEAN PAJAMAS   11. Place CLEAN SHEETS on your bed the night of your first shower and DO NOT SLEEP WITH PETS.    Day of Surgery: Do not apply any deodorants/lotions. Please wear clean clothes to the hospital/surgery center.      Please read over the following fact sheets that you were given. Pain Booklet, Coughing and Deep Breathing,  MRSA Information and Surgical Site Infection Prevention

## 2017-03-15 NOTE — Progress Notes (Signed)
PCP: Dr. Guerry MinorsSydney Greenberg-Chapel Drake Cardiologist: Denies  EKG: 01-10-2016--repeat today ECHO: 09/04/2016--reports having done from referred shoulder pain Stress Test: Denies Cardiac Cath: Denies   Patient denies shortness of breath, fever, cough, and chest pain at PAT appointment.  Patient verbalized understanding of instructions provided today at the PAT appointment.  Patient asked to review instructions at home and day of surgery.   Pt reports he has taken up 4 tabs of extra-strength Tylenol at one time.  Educated pt of dangers of having too much Tylenol--verbalized understanding.

## 2017-03-16 ENCOUNTER — Inpatient Hospital Stay (HOSPITAL_COMMUNITY): Payer: Managed Care, Other (non HMO) | Admitting: Certified Registered"

## 2017-03-16 ENCOUNTER — Encounter (HOSPITAL_COMMUNITY): Payer: Self-pay

## 2017-03-16 ENCOUNTER — Inpatient Hospital Stay (HOSPITAL_COMMUNITY)
Admission: RE | Admit: 2017-03-16 | Discharge: 2017-03-17 | DRG: 472 | Disposition: A | Discharge status: Home / Self Care | Payer: Managed Care, Other (non HMO) | Source: Ambulatory Visit | Attending: Neurosurgery | Admitting: Neurosurgery

## 2017-03-16 ENCOUNTER — Inpatient Hospital Stay (HOSPITAL_COMMUNITY): Payer: Managed Care, Other (non HMO) | Admitting: Emergency Medicine

## 2017-03-16 ENCOUNTER — Inpatient Hospital Stay (HOSPITAL_COMMUNITY): Payer: Managed Care, Other (non HMO)

## 2017-03-16 ENCOUNTER — Encounter (HOSPITAL_COMMUNITY)
Admission: RE | Disposition: A | Discharge status: Home / Self Care | Payer: Self-pay | Source: Ambulatory Visit | Attending: Neurosurgery

## 2017-03-16 DIAGNOSIS — M5412 Radiculopathy, cervical region: Secondary | ICD-10-CM | POA: Diagnosis present

## 2017-03-16 DIAGNOSIS — M4712 Other spondylosis with myelopathy, cervical region: Secondary | ICD-10-CM | POA: Diagnosis present

## 2017-03-16 DIAGNOSIS — Z419 Encounter for procedure for purposes other than remedying health state, unspecified: Secondary | ICD-10-CM

## 2017-03-16 DIAGNOSIS — M5001 Cervical disc disorder with myelopathy,  high cervical region: Secondary | ICD-10-CM | POA: Diagnosis present

## 2017-03-16 DIAGNOSIS — M4802 Spinal stenosis, cervical region: Secondary | ICD-10-CM | POA: Diagnosis present

## 2017-03-16 DIAGNOSIS — F17210 Nicotine dependence, cigarettes, uncomplicated: Secondary | ICD-10-CM | POA: Diagnosis present

## 2017-03-16 DIAGNOSIS — G959 Disease of spinal cord, unspecified: Secondary | ICD-10-CM | POA: Diagnosis present

## 2017-03-16 DIAGNOSIS — M542 Cervicalgia: Secondary | ICD-10-CM | POA: Diagnosis present

## 2017-03-16 HISTORY — PX: ANTERIOR CERVICAL DECOMPRESSION/DISCECTOMY FUSION 4 LEVELS: SHX5556

## 2017-03-16 SURGERY — ANTERIOR CERVICAL DECOMPRESSION/DISCECTOMY FUSION 4 LEVELS
Anesthesia: General | Site: Spine Cervical

## 2017-03-16 MED ORDER — 0.9 % SODIUM CHLORIDE (POUR BTL) OPTIME
TOPICAL | Status: DC | PRN
  Administered 2017-03-16: 1000 mL

## 2017-03-16 MED ORDER — BUPIVACAINE HCL (PF) 0.5 % IJ SOLN
INTRAMUSCULAR | Status: DC | PRN
  Administered 2017-03-16: 10 mL

## 2017-03-16 MED ORDER — ROCURONIUM BROMIDE 100 MG/10ML IV SOLN
INTRAVENOUS | Status: DC | PRN
  Administered 2017-03-16: 10 mg via INTRAVENOUS
  Administered 2017-03-16: 50 mg via INTRAVENOUS
  Administered 2017-03-16: 25 mg via INTRAVENOUS
  Administered 2017-03-16: 50 mg via INTRAVENOUS
  Administered 2017-03-16: 25 mg via INTRAVENOUS

## 2017-03-16 MED ORDER — MUPIROCIN 2 % EX OINT
TOPICAL_OINTMENT | CUTANEOUS | Status: AC
  Administered 2017-03-16: 1
  Filled 2017-03-16: qty 22

## 2017-03-16 MED ORDER — CEFAZOLIN SODIUM-DEXTROSE 2-3 GM-% IV SOLR
INTRAVENOUS | Status: DC | PRN
  Administered 2017-03-16: 2 g via INTRAVENOUS

## 2017-03-16 MED ORDER — PANTOPRAZOLE SODIUM 40 MG IV SOLR
40.0000 mg | Freq: Every day | INTRAVENOUS | Status: DC
Start: 2017-03-16 — End: 2017-03-16

## 2017-03-16 MED ORDER — KCL IN DEXTROSE-NACL 20-5-0.45 MEQ/L-%-% IV SOLN: INTRAVENOUS | Status: DC

## 2017-03-16 MED ORDER — PHENOL 1.4 % MT LIQD
1.0000 | OROMUCOSAL | Status: DC | PRN
  Filled 2017-03-16: qty 177

## 2017-03-16 MED ORDER — FENTANYL CITRATE (PF) 100 MCG/2ML IJ SOLN
INTRAMUSCULAR | Status: DC | PRN
  Administered 2017-03-16: 150 ug via INTRAVENOUS
  Administered 2017-03-16 (×2): 50 ug via INTRAVENOUS

## 2017-03-16 MED ORDER — DOCUSATE SODIUM 100 MG PO CAPS
100.0000 mg | ORAL_CAPSULE | Freq: Two times a day (BID) | ORAL | Status: DC
  Administered 2017-03-16 – 2017-03-17 (×2): 100 mg via ORAL
  Filled 2017-03-16 (×2): qty 1

## 2017-03-16 MED ORDER — CEFAZOLIN SODIUM-DEXTROSE 2-4 GM/100ML-% IV SOLN
2.0000 g | Freq: Three times a day (TID) | INTRAVENOUS | Status: AC
  Administered 2017-03-16 – 2017-03-17 (×2): 2 g via INTRAVENOUS
  Filled 2017-03-16 (×2): qty 100

## 2017-03-16 MED ORDER — ONDANSETRON HCL 4 MG PO TABS: 4.0000 mg | ORAL_TABLET | Freq: Four times a day (QID) | ORAL | Status: DC | PRN

## 2017-03-16 MED ORDER — SUCCINYLCHOLINE CHLORIDE 20 MG/ML IJ SOLN
INTRAMUSCULAR | Status: DC | PRN
  Administered 2017-03-16: 100 mg via INTRAVENOUS

## 2017-03-16 MED ORDER — LIDOCAINE HCL (CARDIAC) 20 MG/ML IV SOLN
INTRAVENOUS | Status: DC | PRN
  Administered 2017-03-16: 80 mg via INTRAVENOUS

## 2017-03-16 MED ORDER — OXYCODONE-ACETAMINOPHEN 5-325 MG PO TABS: 1.0000 | ORAL_TABLET | Freq: Four times a day (QID) | ORAL | Status: DC | PRN

## 2017-03-16 MED ORDER — THROMBIN 20000 UNITS EX SOLR
CUTANEOUS | Status: DC | PRN
  Administered 2017-03-16: 12:00:00 via TOPICAL

## 2017-03-16 MED ORDER — SODIUM CHLORIDE 0.9% FLUSH
3.0000 mL | Freq: Two times a day (BID) | INTRAVENOUS | Status: DC
  Administered 2017-03-16: 3 mL via INTRAVENOUS

## 2017-03-16 MED ORDER — ZOLPIDEM TARTRATE 5 MG PO TABS: 5.0000 mg | ORAL_TABLET | Freq: Every evening | ORAL | Status: DC | PRN

## 2017-03-16 MED ORDER — ROCURONIUM BROMIDE 10 MG/ML (PF) SYRINGE
PREFILLED_SYRINGE | INTRAVENOUS | Status: AC
  Filled 2017-03-16: qty 5

## 2017-03-16 MED ORDER — ONDANSETRON HCL 4 MG/2ML IJ SOLN
INTRAMUSCULAR | Status: DC | PRN
  Administered 2017-03-16: 4 mg via INTRAVENOUS

## 2017-03-16 MED ORDER — OXYCODONE-ACETAMINOPHEN 5-325 MG PO TABS
1.0000 | ORAL_TABLET | ORAL | Status: DC | PRN
  Administered 2017-03-16 – 2017-03-17 (×4): 2 via ORAL
  Filled 2017-03-16 (×3): qty 2

## 2017-03-16 MED ORDER — LIDOCAINE-EPINEPHRINE 1 %-1:100000 IJ SOLN
INTRAMUSCULAR | Status: AC
  Filled 2017-03-16: qty 1

## 2017-03-16 MED ORDER — FENTANYL CITRATE (PF) 100 MCG/2ML IJ SOLN
INTRAMUSCULAR | Status: AC
  Filled 2017-03-16: qty 2

## 2017-03-16 MED ORDER — LIDOCAINE 2% (20 MG/ML) 5 ML SYRINGE
INTRAMUSCULAR | Status: AC
  Filled 2017-03-16: qty 5

## 2017-03-16 MED ORDER — ONDANSETRON HCL 4 MG/2ML IJ SOLN: 4.0000 mg | Freq: Four times a day (QID) | INTRAMUSCULAR | Status: DC | PRN

## 2017-03-16 MED ORDER — BUPIVACAINE HCL (PF) 0.5 % IJ SOLN
INTRAMUSCULAR | Status: AC
  Filled 2017-03-16: qty 30

## 2017-03-16 MED ORDER — ACETAMINOPHEN 325 MG PO TABS: 650.0000 mg | ORAL_TABLET | ORAL | Status: DC | PRN

## 2017-03-16 MED ORDER — ACETAMINOPHEN 650 MG RE SUPP: 650.0000 mg | RECTAL | Status: DC | PRN

## 2017-03-16 MED ORDER — POLYETHYLENE GLYCOL 3350 17 G PO PACK: 17.0000 g | PACK | Freq: Every day | ORAL | Status: DC | PRN

## 2017-03-16 MED ORDER — PANTOPRAZOLE SODIUM 40 MG PO TBEC
40.0000 mg | DELAYED_RELEASE_TABLET | Freq: Every day | ORAL | Status: DC
  Administered 2017-03-16: 40 mg via ORAL
  Filled 2017-03-16: qty 1

## 2017-03-16 MED ORDER — PHENYLEPHRINE HCL 10 MG/ML IJ SOLN
INTRAMUSCULAR | Status: AC
  Filled 2017-03-16: qty 1

## 2017-03-16 MED ORDER — PROPOFOL 10 MG/ML IV BOLUS
INTRAVENOUS | Status: AC
  Filled 2017-03-16: qty 20

## 2017-03-16 MED ORDER — LIDOCAINE-EPINEPHRINE 1 %-1:100000 IJ SOLN
INTRAMUSCULAR | Status: DC | PRN
  Administered 2017-03-16: 10 mL

## 2017-03-16 MED ORDER — SUGAMMADEX SODIUM 200 MG/2ML IV SOLN
INTRAVENOUS | Status: DC | PRN
  Administered 2017-03-16: 200 mg via INTRAVENOUS

## 2017-03-16 MED ORDER — FENTANYL CITRATE (PF) 100 MCG/2ML IJ SOLN
25.0000 ug | INTRAMUSCULAR | Status: DC | PRN
  Administered 2017-03-16 (×3): 25 ug via INTRAVENOUS
  Administered 2017-03-16: 50 ug via INTRAVENOUS
  Administered 2017-03-16: 25 ug via INTRAVENOUS

## 2017-03-16 MED ORDER — PHENYLEPHRINE HCL 10 MG/ML IJ SOLN
INTRAVENOUS | Status: DC | PRN
  Administered 2017-03-16: 25 ug/min via INTRAVENOUS

## 2017-03-16 MED ORDER — THROMBIN 5000 UNITS EX SOLR
CUTANEOUS | Status: AC
  Filled 2017-03-16: qty 5000

## 2017-03-16 MED ORDER — MIDAZOLAM HCL 5 MG/5ML IJ SOLN
INTRAMUSCULAR | Status: DC | PRN
  Administered 2017-03-16: 2 mg via INTRAVENOUS

## 2017-03-16 MED ORDER — CEFAZOLIN SODIUM-DEXTROSE 2-4 GM/100ML-% IV SOLN
INTRAVENOUS | Status: AC
  Filled 2017-03-16: qty 100

## 2017-03-16 MED ORDER — FENTANYL CITRATE (PF) 250 MCG/5ML IJ SOLN
INTRAMUSCULAR | Status: AC
  Filled 2017-03-16: qty 5

## 2017-03-16 MED ORDER — HYDROMORPHONE HCL 1 MG/ML IJ SOLN
0.5000 mg | INTRAMUSCULAR | Status: DC | PRN
  Administered 2017-03-16 – 2017-03-17 (×3): 1 mg via INTRAVENOUS
  Filled 2017-03-16 (×3): qty 1

## 2017-03-16 MED ORDER — METHOCARBAMOL 500 MG PO TABS
500.0000 mg | ORAL_TABLET | Freq: Four times a day (QID) | ORAL | Status: DC | PRN
  Administered 2017-03-16 – 2017-03-17 (×2): 500 mg via ORAL
  Filled 2017-03-16 (×3): qty 1

## 2017-03-16 MED ORDER — MIDAZOLAM HCL 2 MG/2ML IJ SOLN
INTRAMUSCULAR | Status: AC
  Filled 2017-03-16: qty 2

## 2017-03-16 MED ORDER — BISACODYL 10 MG RE SUPP: 10.0000 mg | Freq: Every day | RECTAL | Status: DC | PRN

## 2017-03-16 MED ORDER — THROMBIN 20000 UNITS EX SOLR
CUTANEOUS | Status: AC
  Filled 2017-03-16: qty 20000

## 2017-03-16 MED ORDER — MUPIROCIN 2 % EX OINT
1.0000 "application " | TOPICAL_OINTMENT | Freq: Two times a day (BID) | CUTANEOUS | Status: DC
  Administered 2017-03-16: 1 via NASAL

## 2017-03-16 MED ORDER — DEXAMETHASONE SODIUM PHOSPHATE 10 MG/ML IJ SOLN
INTRAMUSCULAR | Status: AC
Start: 2017-03-16 — End: 2017-03-16
  Filled 2017-03-16: qty 1

## 2017-03-16 MED ORDER — MENTHOL 3 MG MT LOZG
1.0000 | LOZENGE | OROMUCOSAL | Status: DC | PRN
Start: 2017-03-16 — End: 2017-03-17

## 2017-03-16 MED ORDER — DEXAMETHASONE SODIUM PHOSPHATE 10 MG/ML IJ SOLN
INTRAMUSCULAR | Status: DC | PRN
  Administered 2017-03-16: 10 mg via INTRAVENOUS

## 2017-03-16 MED ORDER — SODIUM CHLORIDE 0.9 % IV SOLN
250.0000 mL | INTRAVENOUS | Status: DC
  Administered 2017-03-16: 250 mL via INTRAVENOUS

## 2017-03-16 MED ORDER — GELATIN ABSORBABLE MT POWD
OROMUCOSAL | Status: DC | PRN
  Administered 2017-03-16: 13:00:00 via TOPICAL

## 2017-03-16 MED ORDER — LACTATED RINGERS IV SOLN
INTRAVENOUS | Status: DC
  Administered 2017-03-16 (×3): via INTRAVENOUS

## 2017-03-16 MED ORDER — PHENYLEPHRINE 40 MCG/ML (10ML) SYRINGE FOR IV PUSH (FOR BLOOD PRESSURE SUPPORT)
PREFILLED_SYRINGE | INTRAVENOUS | Status: AC
  Filled 2017-03-16: qty 10

## 2017-03-16 MED ORDER — OXYCODONE-ACETAMINOPHEN 5-325 MG PO TABS
ORAL_TABLET | ORAL | Status: AC
  Filled 2017-03-16: qty 2

## 2017-03-16 MED ORDER — SODIUM CHLORIDE 0.9% FLUSH: 3.0000 mL | INTRAVENOUS | Status: DC | PRN

## 2017-03-16 MED ORDER — PROPOFOL 10 MG/ML IV BOLUS
INTRAVENOUS | Status: DC | PRN
  Administered 2017-03-16: 120 mg via INTRAVENOUS

## 2017-03-16 MED ORDER — SUCCINYLCHOLINE CHLORIDE 200 MG/10ML IV SOSY
PREFILLED_SYRINGE | INTRAVENOUS | Status: AC
  Filled 2017-03-16: qty 10

## 2017-03-16 SURGICAL SUPPLY — 71 items
BASKET BONE COLLECTION (BASKET) ×2 IMPLANT
BIT DRILL 14X2.5XNS TI ANT (BIT) ×1 IMPLANT
BIT DRILL AVIATOR 14 (BIT) ×1
BIT DRILL AVIATOR 14MM (BIT) ×1
BIT DRILL NEURO 2X3.1 SFT TUCH (MISCELLANEOUS) ×2 IMPLANT
BIT DRL 14X2.5XNS TI ANT (BIT) ×1
BNDG GAUZE ELAST 4 BULKY (GAUZE/BANDAGES/DRESSINGS) IMPLANT
BUR BARREL STRAIGHT FLUTE 4.0 (BURR) ×6 IMPLANT
CANISTER SUCT 3000ML PPV (MISCELLANEOUS) ×3 IMPLANT
CARTRIDGE OIL MAESTRO DRILL (MISCELLANEOUS) ×1 IMPLANT
CATH COUDE FOLEY 2W 5CC 16FR (CATHETERS) ×3 IMPLANT
COVER MAYO STAND STRL (DRAPES) ×3 IMPLANT
DECANTER SPIKE VIAL GLASS SM (MISCELLANEOUS) ×3 IMPLANT
DERMABOND ADVANCED (GAUZE/BANDAGES/DRESSINGS) ×2
DERMABOND ADVANCED .7 DNX12 (GAUZE/BANDAGES/DRESSINGS) ×1 IMPLANT
DIFFUSER DRILL AIR PNEUMATIC (MISCELLANEOUS) ×3 IMPLANT
DRAIN HEMOVAC 1/8 X 5 (WOUND CARE) ×3 IMPLANT
DRAPE HALF SHEET 40X57 (DRAPES) IMPLANT
DRAPE LAPAROTOMY 100X72 PEDS (DRAPES) ×3 IMPLANT
DRAPE MICROSCOPE LEICA (MISCELLANEOUS) ×3 IMPLANT
DRAPE POUCH INSTRU U-SHP 10X18 (DRAPES) ×3 IMPLANT
DRILL NEURO 2X3.1 SOFT TOUCH (MISCELLANEOUS) ×6
DRSG OPSITE POSTOP 3X4 (GAUZE/BANDAGES/DRESSINGS) ×3 IMPLANT
DURAPREP 6ML APPLICATOR 50/CS (WOUND CARE) ×3 IMPLANT
ELECT COATED BLADE 2.86 ST (ELECTRODE) ×3 IMPLANT
ELECT REM PT RETURN 9FT ADLT (ELECTROSURGICAL) ×3
ELECTRODE REM PT RTRN 9FT ADLT (ELECTROSURGICAL) ×1 IMPLANT
EVACUATOR SILICONE 100CC (DRAIN) ×3 IMPLANT
GAUZE SPONGE 4X4 12PLY STRL (GAUZE/BANDAGES/DRESSINGS) IMPLANT
GAUZE SPONGE 4X4 16PLY XRAY LF (GAUZE/BANDAGES/DRESSINGS) IMPLANT
GLOVE BIO SURGEON STRL SZ8 (GLOVE) ×3 IMPLANT
GLOVE BIOGEL PI IND STRL 8 (GLOVE) ×1 IMPLANT
GLOVE BIOGEL PI IND STRL 8.5 (GLOVE) ×1 IMPLANT
GLOVE BIOGEL PI INDICATOR 8 (GLOVE) ×2
GLOVE BIOGEL PI INDICATOR 8.5 (GLOVE) ×2
GLOVE ECLIPSE 8.0 STRL XLNG CF (GLOVE) ×3 IMPLANT
GLOVE EXAM NITRILE LRG STRL (GLOVE) IMPLANT
GLOVE EXAM NITRILE XL STR (GLOVE) IMPLANT
GLOVE EXAM NITRILE XS STR PU (GLOVE) IMPLANT
GOWN STRL REUS W/ TWL LRG LVL3 (GOWN DISPOSABLE) IMPLANT
GOWN STRL REUS W/ TWL XL LVL3 (GOWN DISPOSABLE) IMPLANT
GOWN STRL REUS W/TWL 2XL LVL3 (GOWN DISPOSABLE) IMPLANT
GOWN STRL REUS W/TWL LRG LVL3 (GOWN DISPOSABLE)
GOWN STRL REUS W/TWL XL LVL3 (GOWN DISPOSABLE)
HALTER HD/CHIN CERV TRACTION D (MISCELLANEOUS) ×3 IMPLANT
HEMOSTAT POWDER KIT SURGIFOAM (HEMOSTASIS) ×3 IMPLANT
KIT BASIN OR (CUSTOM PROCEDURE TRAY) ×3 IMPLANT
KIT ROOM TURNOVER OR (KITS) ×3 IMPLANT
NEEDLE HYPO 18GX1.5 BLUNT FILL (NEEDLE) ×3 IMPLANT
NEEDLE HYPO 25X1 1.5 SAFETY (NEEDLE) ×3 IMPLANT
NEEDLE SPNL 22GX3.5 QUINCKE BK (NEEDLE) ×6 IMPLANT
NS IRRIG 1000ML POUR BTL (IV SOLUTION) ×3 IMPLANT
OIL CARTRIDGE MAESTRO DRILL (MISCELLANEOUS) ×3
PACK LAMINECTOMY NEURO (CUSTOM PROCEDURE TRAY) ×3 IMPLANT
PAD ARMBOARD 7.5X6 YLW CONV (MISCELLANEOUS) ×3 IMPLANT
PEEK SPACER AVS AS 6X14X16 4D (Cage) ×6 IMPLANT
PIN DISTRACTION 14MM (PIN) ×6 IMPLANT
PLATE AVIATOR ASSY 4LVL SZ 60 (Plate) ×3 IMPLANT
RUBBERBAND STERILE (MISCELLANEOUS) ×6 IMPLANT
SCREW AVIAT VAR SLFTAP 4.35X12 (Screw) ×3 IMPLANT
SCREW AVIAT VAR SLFTAP 4.35X14 (Screw) ×3 IMPLANT
SCREW AVIATOR VAR SELFTAP 4X14 (Screw) ×27 IMPLANT
SPACER CERV AVS 14X16X5MM 4D (Spacer) ×6 IMPLANT
SPONGE INTESTINAL PEANUT (DISPOSABLE) ×3 IMPLANT
SPONGE SURGIFOAM ABS GEL 100 (HEMOSTASIS) IMPLANT
STAPLER SKIN PROX WIDE 3.9 (STAPLE) IMPLANT
SUT VIC AB 3-0 SH 8-18 (SUTURE) ×3 IMPLANT
SYR 3ML LL SCALE MARK (SYRINGE) ×3 IMPLANT
TOWEL GREEN STERILE (TOWEL DISPOSABLE) ×3 IMPLANT
TOWEL GREEN STERILE FF (TOWEL DISPOSABLE) ×2 IMPLANT
WATER STERILE IRR 1000ML POUR (IV SOLUTION) ×3 IMPLANT

## 2017-03-16 NOTE — Anesthesia Procedure Notes (Signed)
Procedure Name: Intubation Date/Time: 03/16/2017 2:05 PM Performed by: Lavell Luster Pre-anesthesia Checklist: Patient identified, Emergency Drugs available, Suction available and Patient being monitored Patient Re-evaluated:Patient Re-evaluated prior to inductionOxygen Delivery Method: Circle system utilized Preoxygenation: Pre-oxygenation with 100% oxygen Intubation Type: IV induction Ventilation: Mask ventilation without difficulty Laryngoscope Size: Mac, 4 and Glidescope Grade View: Grade I Tube type: Oral Number of attempts: 1 Airway Equipment and Method: Stylet and Video-laryngoscopy Placement Confirmation: ETT inserted through vocal cords under direct vision,  positive ETCO2 and breath sounds checked- equal and bilateral Secured at: 23 cm Tube secured with: Tape Dental Injury: Teeth and Oropharynx as per pre-operative assessment

## 2017-03-16 NOTE — Progress Notes (Signed)
Awake, alert, conversant.  Mild left deltoid weakness persists, full strength all other motor groups.  Sore in neck.  Patient doing well.

## 2017-03-16 NOTE — Interval H&P Note (Signed)
History and Physical Interval Note:  03/16/2017 1:48 PM  Ethan IslamKenneth Albrecht  has presented today for surgery, with the diagnosis of Cervical herniated disc with myelopathy  The various methods of treatment have been discussed with the patient and family. After consideration of risks, benefits and other options for treatment, the patient has consented to  Procedure(s) with comments: C3-4 C4-5 C5-6 C6-7 Anterior cervical decompression/discectomy/fusion (N/A) - C3-4 C4-5 C5-6 C6-7 Anterior cervical decompression/discectomy/fusion as a surgical intervention .  The patient's history has been reviewed, patient examined, no change in status, stable for surgery.  I have reviewed the patient's chart and labs.  Questions were answered to the patient's satisfaction.     [name]

## 2017-03-16 NOTE — Anesthesia Preprocedure Evaluation (Signed)
Anesthesia Evaluation  Patient identified by MRN, date of birth, ID band Patient awake    Reviewed: Allergy & Precautions, H&P , NPO status , Patient's Chart, lab work & pertinent test results  Airway Mallampati: II  TM Distance: >3 FB Neck ROM: Full    Dental no notable dental hx. (+) Poor Dentition, Dental Advisory Given   Pulmonary Current Smoker,    Pulmonary exam normal breath sounds clear to auscultation       Cardiovascular negative cardio ROS   Rhythm:Regular Rate:Normal     Neuro/Psych negative neurological ROS  negative psych ROS   GI/Hepatic negative GI ROS, Neg liver ROS,   Endo/Other  negative endocrine ROS  Renal/GU negative Renal ROS  negative genitourinary   Musculoskeletal  (+) Arthritis , Osteoarthritis,    Abdominal   Peds  Hematology negative hematology ROS (+)   Anesthesia Other Findings   Reproductive/Obstetrics negative OB ROS                             Anesthesia Physical Anesthesia Plan  ASA: II  Anesthesia Plan: General   Post-op Pain Management:    Induction: Intravenous  Airway Management Planned: Oral ETT  Additional Equipment:   Intra-op Plan:   Post-operative Plan: Extubation in OR  Informed Consent: I have reviewed the patients History and Physical, chart, labs and discussed the procedure including the risks, benefits and alternatives for the proposed anesthesia with the patient or authorized representative who has indicated his/her understanding and acceptance.   Dental advisory given  Plan Discussed with: CRNA  Anesthesia Plan Comments:         Anesthesia Quick Evaluation

## 2017-03-16 NOTE — Op Note (Signed)
03/16/2017  5:42 PM  PATIENT:  Ethan Drake  51 y.o. male  PRE-OPERATIVE DIAGNOSIS:  Cervical herniated disc with myelopathy, cervical stenosis, cervical radiculopathy, cervicalgia C 34, C 45, C 56, C 67 levels  POST-OPERATIVE DIAGNOSIS:   Cervical herniated disc with myelopathy, cervical stenosis, cervical radiculopathy, cervicalgia C 34, C 45, C 56, C 67 levels  PROCEDURE:  Procedure(s) with comments: Cervical Three-Four Cervical Four-Five Cervical Five-Six Cervical Six-Seven Anterior Cervical Decompression/Discectomy/Fusion (N/A) - C3-4 C4-5 C5-6 C6-7 Anterior cervical decompression/discectomy/fusion with PEEK cages, autograft, plate  SURGEON:  Surgeon(s) and Role:    * Cael Worth, MD - Primary  PHYSICIAN ASSISTANT: Nundkumar, MD  ASSISTANTS: Poteat, RN   ANESTHESIA:   general  EBL:  Total I/O In: 1000 [I.V.:1000] Out: 100 [Blood:100]  BLOOD ADMINISTERED:none  DRAINS: (10) Jackson-Pratt drain(s) with closed bulb suction in the prevertebral space   LOCAL MEDICATIONS USED:  LIDOCAINE   SPECIMEN:  No Specimen  DISPOSITION OF SPECIMEN:  N/A  COUNTS:  YES  TOURNIQUET:  * No tourniquets in log *  DICTATION: Patient was brought to operating room and following the smooth and uncomplicated induction of general endotracheal anesthesia her head was placed on a horseshoe head holder she was placed in 5 pounds of Holter traction and his anterior neck was prepped and draped in usual sterile fashion. An incision was made on the left side of midline after infiltrating the skin and subcutaneous tissues with local lidocaine. The platysmal layer was incised and subplatysmal dissection was performed exposing the anterior border sternocleidomastoid muscle. Using blunt dissection the carotid sheath was kept lateral and trachea and esophagus kept medial exposing the anterior cervical spine. A bent spinal needle was placed it was felt to be the C 45 level and this was confirmed on  intraoperative x-ray. Longus coli muscles were taken down from the anterior cervical spine using electrocautery and key elevator and self-retaining retractor was placed. The interspaces from C3/4 to C6/7  were incised and a thorough discectomies were performed. Distraction pins were placed at the C3/4 level. Uncinate spurs and central spondylitic ridges were drilled down with a high-speed drill. The spinal cord dura and both C4 nerve roots were widely decompressed. Hemostasis was assured. After trial sizing a 5 mm peek interbody cage was selected and packed with local autograft. The graft was tamped into position and countersunk appropriately.  Distraction pins were placed at the C4/5 level. Uncinate spurs and central spondylitic ridges were drilled down with a high-speed drill. The spinal cord dura and both C5 nerve roots were widely decompressed. Hemostasis was assured. After trial sizing an 5 mm peek interbody cage was selected and packed in a similar fashion. The graft was tamped into position and countersunk appropriately. The retractor was moved and the interspace at C 56 was incised and a thorough discectomy was performed. Distraction pins were placed. Uncinate spurs and central spondylitic ridges were drilled down with a high-speed drill. The spinal cord dura and both C 6 nerve roots were widely decompressed. Hemostasis was assured. After trial sizing a 6 mm peek interbody cage was selected and packed in a similar fashion. The graft was tamped into position and countersunk appropriately.The interspace at C 67 was incised and a thorough discectomy was performed. Distraction pins were placed. Uncinate spurs and central spondylitic ridges were drilled down with a high-speed drill.   This interspace was overgrown with bone and it was drilled open with the high speed drill. The spinal cord dura and both C 7   nerve roots were widely decompressed. Hemostasis was assured. After trial sizing a 6 mm peek interbody cage  was selected and packed with profuse block and autograft. The graft was tamped into position and countersunk appropriately.  Distraction weight was removed. A 60 mm Aviator anterior cervical plate was affixed to the cervical spine with 14 mm variable-angle screws 2 at C3, 2 at C4, 2 at C5,  and 2 at C6, and 2 at C7. All screws were well-positioned and locking mechanisms were engaged. Soft tissues were inspected and found to be in good repair. The wound was irrigated. A final x-ray was obtained with good visualization at C3 through C6 with the interbody grafts well visualized. A #10 JP drain was inserted through a separate stab incision. The platysma layer was closed with 3-0 Vicryl stitches and the skin was reapproximated with 3-0 Vicryl subcuticular stitches. The wound was dressed with Dermabond. Counts were correct at the end of the case. Patient was extubated and taken to recovery in stable and satisfactory condition.    PLAN OF CARE: Admit to inpatient   PATIENT DISPOSITION:  PACU - hemodynamically stable.   Delay start of Pharmacological VTE agent (>24hrs) due to surgical blood loss or risk of bleeding: yes  

## 2017-03-16 NOTE — Progress Notes (Signed)
Pt voided upon adm to 3C. Small amt of bleeding from meatus. Per CRNA, OR had unsuccessfully attempted to insert regular foley, as well as Coude cath. 3C RN will continue to monitor.

## 2017-03-16 NOTE — Transfer of Care (Signed)
Immediate Anesthesia Transfer of Care Note  Patient: Ethan IslamKenneth Prothero  Procedure(s) Performed: Procedure(s) with comments: Cervical Three-Four Cervical Four-Five Cervical Five-Six Cervical Six-Seven Anterior Cervical Decompression/Discectomy/Fusion (N/A) - C3-4 C4-5 C5-6 C6-7 Anterior cervical decompression/discectomy/fusion  Patient Location: PACU  Anesthesia Type:General  Level of Consciousness: awake, alert  and oriented  Airway & Oxygen Therapy: Patient Spontanous Breathing and Patient connected to face mask oxygen  Post-op Assessment: Report given to RN and Post -op Vital signs reviewed and stable  Post vital signs: Reviewed and stable  Last Vitals:  Vitals:   03/16/17 1035  BP: (!) 149/82  Pulse: (!) 54  Resp: 20  Temp: 36.7 C    Last Pain:  Vitals:   03/16/17 1052  TempSrc:   PainSc: 6       Patients Stated Pain Goal: 3 (03/16/17 1052)  Complications: No apparent anesthesia complications

## 2017-03-16 NOTE — Anesthesia Postprocedure Evaluation (Signed)
Anesthesia Post Note  Patient: Ethan Drake  Procedure(s) Performed: Procedure(s) (LRB): Cervical Three-Four Cervical Four-Five Cervical Five-Six Cervical Six-Seven Anterior Cervical Decompression/Discectomy/Fusion (N/A)  Patient location during evaluation: PACU Anesthesia Type: General Level of consciousness: awake and alert Pain management: pain level controlled Vital Signs Assessment: post-procedure vital signs reviewed and stable Respiratory status: spontaneous breathing, nonlabored ventilation, respiratory function stable and patient connected to nasal cannula oxygen Cardiovascular status: blood pressure returned to baseline and stable Postop Assessment: no signs of nausea or vomiting Anesthetic complications: no       Last Vitals:  Vitals:   03/16/17 1800 03/16/17 1815  BP:    Pulse: 84 79  Resp: 13 16  Temp: 37 C     Last Pain:  Vitals:   03/16/17 1052  TempSrc:   PainSc: 6                  Geisha Abernathy DAVID

## 2017-03-16 NOTE — Brief Op Note (Signed)
03/16/2017  5:42 PM  PATIENT:  Ethan Drake  51 y.o. male  PRE-OPERATIVE DIAGNOSIS:  Cervical herniated disc with myelopathy, cervical stenosis, cervical radiculopathy, cervicalgia C 34, C 45, C 56, C 67 levels  POST-OPERATIVE DIAGNOSIS:   Cervical herniated disc with myelopathy, cervical stenosis, cervical radiculopathy, cervicalgia C 34, C 45, C 56, C 67 levels  PROCEDURE:  Procedure(s) with comments: Cervical Three-Four Cervical Four-Five Cervical Five-Six Cervical Six-Seven Anterior Cervical Decompression/Discectomy/Fusion (N/A) - C3-4 C4-5 C5-6 C6-7 Anterior cervical decompression/discectomy/fusion with PEEK cages, autograft, plate  SURGEON:  Surgeon(s) and Role:    [name], MD - Primary  PHYSICIAN ASSISTANT: Conchita Paris, MD  ASSISTANTS: Poteat, RN   ANESTHESIA:   general  EBL:  Total I/O In: 1000 [I.V.:1000] Out: 100 [Blood:100]  BLOOD ADMINISTERED:none  DRAINS: (10) Jackson-Pratt drain(s) with closed bulb suction in the prevertebral space   LOCAL MEDICATIONS USED:  LIDOCAINE   SPECIMEN:  No Specimen  DISPOSITION OF SPECIMEN:  N/A  COUNTS:  YES  TOURNIQUET:  * No tourniquets in log *  DICTATION: Patient was brought to operating room and following the smooth and uncomplicated induction of general endotracheal anesthesia her head was placed on a horseshoe head holder she was placed in 5 pounds of Holter traction and his anterior neck was prepped and draped in usual sterile fashion. An incision was made on the left side of midline after infiltrating the skin and subcutaneous tissues with local lidocaine. The platysmal layer was incised and subplatysmal dissection was performed exposing the anterior border sternocleidomastoid muscle. Using blunt dissection the carotid sheath was kept lateral and trachea and esophagus kept medial exposing the anterior cervical spine. A bent spinal needle was placed it was felt to be the C 45 level and this was confirmed on  intraoperative x-ray. Longus coli muscles were taken down from the anterior cervical spine using electrocautery and key elevator and self-retaining retractor was placed. The interspaces from C3/4 to C6/7  were incised and a thorough discectomies were performed. Distraction pins were placed at the C3/4 level. Uncinate spurs and central spondylitic ridges were drilled down with a high-speed drill. The spinal cord dura and both C4 nerve roots were widely decompressed. Hemostasis was assured. After trial sizing a 5 mm peek interbody cage was selected and packed with local autograft. The graft was tamped into position and countersunk appropriately.  Distraction pins were placed at the C4/5 level. Uncinate spurs and central spondylitic ridges were drilled down with a high-speed drill. The spinal cord dura and both C5 nerve roots were widely decompressed. Hemostasis was assured. After trial sizing an 5 mm peek interbody cage was selected and packed in a similar fashion. The graft was tamped into position and countersunk appropriately. The retractor was moved and the interspace at C 56 was incised and a thorough discectomy was performed. Distraction pins were placed. Uncinate spurs and central spondylitic ridges were drilled down with a high-speed drill. The spinal cord dura and both C 6 nerve roots were widely decompressed. Hemostasis was assured. After trial sizing a 6 mm peek interbody cage was selected and packed in a similar fashion. The graft was tamped into position and countersunk appropriately.The interspace at C 67 was incised and a thorough discectomy was performed. Distraction pins were placed. Uncinate spurs and central spondylitic ridges were drilled down with a high-speed drill.   This interspace was overgrown with bone and it was drilled open with the high speed drill. The spinal cord dura and both C 7  nerve roots were widely decompressed. Hemostasis was assured. After trial sizing a 6 mm peek interbody cage  was selected and packed with profuse block and autograft. The graft was tamped into position and countersunk appropriately.  Distraction weight was removed. A 60 mm Aviator anterior cervical plate was affixed to the cervical spine with 14 mm variable-angle screws 2 at C3, 2 at C4, 2 at C5,  and 2 at C6, and 2 at C7. All screws were well-positioned and locking mechanisms were engaged. Soft tissues were inspected and found to be in good repair. The wound was irrigated. A final x-ray was obtained with good visualization at C3 through C6 with the interbody grafts well visualized. A #10 JP drain was inserted through a separate stab incision. The platysma layer was closed with 3-0 Vicryl stitches and the skin was reapproximated with 3-0 Vicryl subcuticular stitches. The wound was dressed with Dermabond. Counts were correct at the end of the case. Patient was extubated and taken to recovery in stable and satisfactory condition.    PLAN OF CARE: Admit to inpatient   PATIENT DISPOSITION:  PACU - hemodynamically stable.   Delay start of Pharmacological VTE agent (>24hrs) due to surgical blood loss or risk of bleeding: yes

## 2017-03-16 NOTE — OR Nursing (Signed)
Attempted to insert foley catheter [facility] insertion unsuccessful times two attempts Dr Venetia MaxonStern present stated okay to start  without foley aware of difficulty even with coude catheter notice pink tinge blood at tip of coude when pulled back out.

## 2017-03-17 ENCOUNTER — Encounter (HOSPITAL_COMMUNITY): Payer: Self-pay | Admitting: Neurosurgery

## 2017-03-17 MED ORDER — OXYCODONE-ACETAMINOPHEN 5-325 MG PO TABS: 2.0000 | ORAL_TABLET | ORAL | 0 refills | Status: DC | PRN

## 2017-03-17 MED ORDER — METHOCARBAMOL 500 MG PO TABS: 500.0000 mg | ORAL_TABLET | Freq: Four times a day (QID) | ORAL | 0 refills | Status: DC | PRN

## 2017-03-17 MED ORDER — DIAZEPAM 5 MG PO TABS
5.0000 mg | ORAL_TABLET | Freq: Once | ORAL | Status: AC
  Administered 2017-03-17: 5 mg via ORAL
  Filled 2017-03-17: qty 1

## 2017-03-17 MED ORDER — METHOCARBAMOL 1000 MG/10ML IJ SOLN
500.0000 mg | Freq: Four times a day (QID) | INTRAMUSCULAR | Status: DC | PRN
  Administered 2017-03-17: 500 mg via INTRAVENOUS
  Filled 2017-03-17: qty 5

## 2017-03-17 NOTE — Discharge Summary (Signed)
Physician Discharge Summary  Patient ID: Ethan Drake MRN: [id number] DOB/AGE: 53/11/1965 53 y.o.  Admit date: 03/16/2017 Discharge date: 03/17/2017  Admission Diagnoses: Cervical spondylosis with myelopathy  Discharge Diagnoses: Same Active Problems:   Cervical myelopathy [facility](HCC)   Discharged Condition: Stable  Hospital Course:  Mrs. Ethan Drake is a 51 y.o. male electively admitted after ACDF. Postoperatively, the patient was at neurologic baseline, reporting relief of his arm pain and improvement in strength. He was tolerating diet, ambulating independently, voiding normally, with pain controlled with oral medication.  Treatments: Surgery - ACDF  Discharge Exam: Blood pressure 130/82, pulse (!) 54, temperature 98.3 F (36.8 C), temperature source Oral, resp. rate 16, height 5' 5.98" (1.676 m), weight 72.1 kg (159 lb), SpO2 100 %. Awake, alert, oriented Speech fluent, appropriate CN grossly intact 4+/5 left deltoid, decreased ROM in abduction, otherwise good strength Wound c/d/i  Disposition: 01-Home or Self Care  Discharge Instructions    Call MD for:  redness, tenderness, or signs of infection (pain, swelling, redness, odor or green/yellow discharge around incision site)    Complete by:  As directed    Call MD for:  temperature >100.4    Complete by:  As directed    Diet - low sodium heart healthy    Complete by:  As directed    Discharge instructions    Complete by:  As directed    Walk at home as much as possible, at least 4 times / day   Increase activity slowly    Complete by:  As directed    Lifting restrictions    Complete by:  As directed    No lifting > 10 lbs   May shower / Bathe    Complete by:  As directed    48 hours after surgery   May walk up steps    Complete by:  As directed    No dressing needed    Complete by:  As directed    Other Restrictions    Complete by:  As directed    No bending/twisting at waist     Allergies as of  03/17/2017      Reactions   Peanut (diagnostic) Anaphylaxis   Vicodin [hydrocodone-acetaminophen] Anaphylaxis   03/16/17:  Tolerates Oxycodone.kph   Calamine Itching   Irritates eczema      Medication List    STOP taking these medications   acetaminophen 500 MG tablet Commonly known as:  TYLENOL     TAKE these medications   ibuprofen 200 MG tablet Commonly known as:  ADVIL,MOTRIN Take 400-800 mg by mouth every 6 (six) hours as needed for moderate pain.   meloxicam 15 MG tablet Commonly known as:  MOBIC Take 1 tablet (15 mg total) by mouth daily. Take 1 daily with food.   methocarbamol 500 MG tablet Commonly known as:  ROBAXIN Take 1 tablet (500 mg total) by mouth every 6 (six) hours as needed for muscle spasms.   naproxen sodium 220 MG tablet Commonly known as:  ANAPROX Take 220 mg by mouth 2 (two) times daily with a meal.   oxyCODONE-acetaminophen 5-325 MG tablet Commonly known as:  PERCOCET Take 2 tablets by mouth every 4 (four) hours as needed for severe pain. What changed:  how much to take  when to take this            Durable Medical Equipment        Start     Ordered   03/17/17 0907  For home  use only DME 3 n 1  Once     03/17/17 6962     Follow-up Information    [name], MD Follow up in 3 week(s).   Specialty:  Neurosurgery Contact information: [address] [place] [address] [phone]           Signed: Lisbeth Renshaw, Salena Saner 03/17/2017, 9:50 AM
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
## 2017-03-17 NOTE — Progress Notes (Signed)
Discharge instructions reviewed with patient. RXs given. Equipment at bedside. All questions answered. Transport home by family,   BurneyHavy, RCharity fundraiser

## 2020-04-01 ENCOUNTER — Encounter: Payer: Self-pay | Admitting: Emergency Medicine

## 2020-04-01 ENCOUNTER — Other Ambulatory Visit: Payer: Self-pay

## 2020-04-01 ENCOUNTER — Emergency Department
Admission: EM | Admit: 2020-04-01 | Discharge: 2020-04-01 | Disposition: A | Discharge status: Home / Self Care | Payer: Medicaid Other | Attending: Emergency Medicine | Admitting: Emergency Medicine

## 2020-04-01 ENCOUNTER — Emergency Department: Payer: Medicaid Other

## 2020-04-01 DIAGNOSIS — M542 Cervicalgia: Secondary | ICD-10-CM | POA: Insufficient documentation

## 2020-04-01 DIAGNOSIS — R519 Headache, unspecified: Secondary | ICD-10-CM | POA: Insufficient documentation

## 2020-04-01 DIAGNOSIS — Z79899 Other long term (current) drug therapy: Secondary | ICD-10-CM | POA: Diagnosis not present

## 2020-04-01 DIAGNOSIS — Z91010 Allergy to peanuts: Secondary | ICD-10-CM | POA: Insufficient documentation

## 2020-04-01 DIAGNOSIS — M25511 Pain in right shoulder: Secondary | ICD-10-CM | POA: Diagnosis not present

## 2020-04-01 DIAGNOSIS — F17210 Nicotine dependence, cigarettes, uncomplicated: Secondary | ICD-10-CM | POA: Insufficient documentation

## 2020-04-01 DIAGNOSIS — M549 Dorsalgia, unspecified: Secondary | ICD-10-CM | POA: Insufficient documentation

## 2020-04-01 MED ORDER — KETOROLAC TROMETHAMINE 30 MG/ML IJ SOLN
30.0000 mg | Freq: Once | INTRAMUSCULAR | Status: AC
  Administered 2020-04-01: 30 mg via INTRAMUSCULAR
  Filled 2020-04-01: qty 1

## 2020-04-01 MED ORDER — CYCLOBENZAPRINE HCL 5 MG PO TABS: ORAL_TABLET | ORAL | 0 refills | Status: DC

## 2020-04-01 MED ORDER — KETOROLAC TROMETHAMINE 10 MG PO TABS: 10.0000 mg | ORAL_TABLET | Freq: Four times a day (QID) | ORAL | 0 refills | Status: DC | PRN
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
## 2022-09-18 ENCOUNTER — Ambulatory Visit (INDEPENDENT_AMBULATORY_CARE_PROVIDER_SITE_OTHER): Payer: Medicaid Other | Admitting: Family Medicine

## 2022-09-18 ENCOUNTER — Encounter: Payer: Self-pay | Admitting: Family Medicine

## 2022-09-18 VITALS — BP 120/70 | HR 66 | Ht 66.0 in | Wt 153.0 lb

## 2022-09-18 DIAGNOSIS — F17210 Nicotine dependence, cigarettes, uncomplicated: Secondary | ICD-10-CM

## 2022-09-18 DIAGNOSIS — Z8739 Personal history of other diseases of the musculoskeletal system and connective tissue: Secondary | ICD-10-CM

## 2022-09-18 NOTE — Progress Notes (Signed)
Date:  09/18/2022   Name:  Ethan Drake   DOB:  1966/09/21   MRN:  [id number]   Chief Complaint: Establish Care  Patient is a 56 year old male who presents for a comprehensive physical exam. The patient reports the following problems: need physical. Health maintenance has been reviewed multiple area to address.      Lab Results  Component Value Date   NA 139 03/15/2017   K 4.1 03/15/2017   CO2 23 03/15/2017   GLUCOSE 77 03/15/2017   BUN 15 03/15/2017   CREATININE 0.99 03/15/2017   CALCIUM 9.3 03/15/2017   GFRNONAA >60 03/15/2017   No results found for: "CHOL", "HDL", "LDLCALC", "LDLDIRECT", "TRIG", "CHOLHDL" No results found for: "TSH" No results found for: "HGBA1C" Lab Results  Component Value Date   WBC 8.7 03/15/2017   HGB 13.7 03/15/2017   HCT 41.5 03/15/2017   MCV 89.1 03/15/2017   PLT 340 03/15/2017   Lab Results  Component Value Date   ALT 33 03/15/2017   AST 47 (H) 03/15/2017   ALKPHOS 63 03/15/2017   BILITOT 0.3 03/15/2017   No results found for: "25OHVITD2", "25OHVITD3", "VD25OH"   Review of Systems  Constitutional:  Negative for chills and fever.  HENT:  Negative for drooling, ear discharge, ear pain and sore throat.   Respiratory:  Negative for cough, shortness of breath and wheezing.   Cardiovascular:  Negative for chest pain, palpitations and leg swelling.  Gastrointestinal:  Negative for abdominal pain, blood in stool, constipation, diarrhea and nausea.  Endocrine: Negative for polydipsia.  Genitourinary:  Negative for dysuria, frequency, hematuria and urgency.  Musculoskeletal:  Negative for back pain, myalgias and neck pain.  Skin:  Negative for rash.  Allergic/Immunologic: Negative for environmental allergies.  Neurological:  Negative for dizziness and headaches.  Hematological:  Does not bruise/bleed easily.  Psychiatric/Behavioral:  Negative for suicidal ideas. The patient is not nervous/anxious.     Patient Active Problem List    Diagnosis Date Noted   Cervical myelopathy (HCC) 03/16/2017    Allergies  Allergen Reactions   Peanut (Diagnostic) Anaphylaxis   Vicodin [Hydrocodone-Acetaminophen] Anaphylaxis    03/16/17:  Tolerates Oxycodone.kph   Calamine Itching    Irritates eczema    Past Surgical History:  Procedure Laterality Date   ANTERIOR CERVICAL DECOMPRESSION/DISCECTOMY FUSION 4 LEVELS N/A 03/16/2017   Procedure: Cervical Three-Four Cervical Four-Five Cervical Five-Six Cervical Six-Seven Anterior Cervical Decompression/Discectomy/Fusion;  Surgeon: [name], MD;  Location: [facility] OR;  Service: Neurosurgery;  Laterality: N/A;  C3-4 C4-5 C5-6 C6-7 Anterior cervical decompression/discectomy/fusion   HIP ASPIRATION LEFT ([facility] HX)     MULTIPLE TOOTH EXTRACTIONS      Social History   Tobacco Use   Smoking status: Every Day    Packs/day: 0.50    Years: 30.00    Total pack years: 15.00    Types: Cigarettes   Smokeless tobacco: Former  Building services engineer Use: Never used  Substance Use Topics   Alcohol use: Yes    Comment: about 2 beers a day   Drug use: Yes    Types: Marijuana    Comment: last used on 03-08-2017     Medication list has been reviewed and updated.  No outpatient medications have been marked as taking for the 09/18/22 encounter (Office Visit) with Duanne Limerick, MD.       09/18/2022    1:31 PM  GAD 7 : Generalized Anxiety Score  Nervous, Anxious, on Edge 0  Control/stop worrying 0  Worry too much - different things 0  Trouble relaxing 0  Restless 0  Easily annoyed or irritable 2  Afraid - awful might happen 0  Total GAD 7 Score 2  Anxiety Difficulty Not difficult at all       09/18/2022    1:31 PM  Depression screen PHQ 2/9  Decreased Interest 1  Down, Depressed, Hopeless 1  PHQ - 2 Score 2  Altered sleeping 0  Tired, decreased energy 2  Change in appetite 1  Feeling bad or failure about yourself  0  Trouble concentrating 0  Moving slowly or fidgety/restless  0  Suicidal thoughts 0  PHQ-9 Score 5  Difficult doing work/chores Somewhat difficult    BP Readings from Last 3 Encounters:  09/18/22 120/70  04/01/20 103/81  03/17/17 130/82    Physical Exam HENT:     Right Ear: Tympanic membrane and ear canal normal.     Left Ear: Tympanic membrane and ear canal normal.     Nose: Nose normal.     Mouth/Throat:     Mouth: Mucous membranes are moist.     Dentition: No dental caries.     Pharynx: Oropharynx is clear. Uvula midline.  Eyes:     Extraocular Movements: Extraocular movements intact.     Pupils: Pupils are equal, round, and reactive to light.  Neck:     Thyroid: No thyroid mass or thyroid tenderness.  Cardiovascular:     Rate and Rhythm: Normal rate.     Pulses: Normal pulses.     Heart sounds: S1 normal and S2 normal. No murmur heard.    No systolic murmur is present.     No diastolic murmur is present.     No friction rub. No gallop. No S3 or S4 sounds.  Pulmonary:     Breath sounds: No decreased breath sounds, wheezing, rhonchi or rales.  Abdominal:     Palpations: There is no hepatomegaly or splenomegaly.     Tenderness: There is no abdominal tenderness.  Musculoskeletal:     Cervical back: Neck supple.     Right lower leg: No edema.     Left lower leg: No edema.  Lymphadenopathy:     Cervical: No cervical adenopathy.     Wt Readings from Last 3 Encounters:  09/18/22 153 lb (69.4 kg)  04/01/20 150 lb (68 kg)  03/16/17 159 lb (72.1 kg)    BP 120/70   Pulse 66   Ht 5\' 6"  (1.676 m)   Wt 153 lb (69.4 kg)   SpO2 99%   BMI 24.69 kg/m   Assessment and Plan:  1. Cigarette nicotine dependence without complication Patient has been advised of the health risks of smoking and counseled concerning cessation of tobacco products. I spent over 3 minutes for discussion and to answer questions.   2. History of degenerative disc disease Patient with history of cervical spondylosis with myelopathy consisting of anterior  decompression/discectomy/fusion at 4 levels.  Patient has not been back to see anybody concerning this including neurosurgery.  Patient's been told in the future after we do a physical week would likely refer to neurosurgery.  If necessary. Otilio Miu, MD

## 2022-10-21 ENCOUNTER — Encounter: Payer: Medicaid Other | Admitting: Family Medicine

## 2024-01-06 ENCOUNTER — Encounter: Payer: Medicaid Other | Admitting: Nurse Practitioner

## 2024-02-01 ENCOUNTER — Ambulatory Visit
Admission: RE | Admit: 2024-02-01 | Discharge: 2024-02-01 | Disposition: A | Discharge status: Home / Self Care | Source: Ambulatory Visit | Attending: Nurse Practitioner

## 2024-02-01 ENCOUNTER — Encounter: Payer: Self-pay | Admitting: Nurse Practitioner

## 2024-02-01 ENCOUNTER — Ambulatory Visit (INDEPENDENT_AMBULATORY_CARE_PROVIDER_SITE_OTHER): Payer: Medicaid Other | Admitting: Nurse Practitioner

## 2024-02-01 VITALS — BP 136/82 | HR 70 | Temp 97.8°F | Ht 65.85 in | Wt 145.6 lb

## 2024-02-01 DIAGNOSIS — Z13220 Encounter for screening for lipoid disorders: Secondary | ICD-10-CM | POA: Diagnosis not present

## 2024-02-01 DIAGNOSIS — Z720 Tobacco use: Secondary | ICD-10-CM | POA: Insufficient documentation

## 2024-02-01 DIAGNOSIS — Z125 Encounter for screening for malignant neoplasm of prostate: Secondary | ICD-10-CM | POA: Diagnosis not present

## 2024-02-01 DIAGNOSIS — Z131 Encounter for screening for diabetes mellitus: Secondary | ICD-10-CM

## 2024-02-01 DIAGNOSIS — R519 Headache, unspecified: Secondary | ICD-10-CM | POA: Diagnosis not present

## 2024-02-01 DIAGNOSIS — R202 Paresthesia of skin: Secondary | ICD-10-CM | POA: Insufficient documentation

## 2024-02-01 DIAGNOSIS — Z1159 Encounter for screening for other viral diseases: Secondary | ICD-10-CM | POA: Diagnosis not present

## 2024-02-01 DIAGNOSIS — Z122 Encounter for screening for malignant neoplasm of respiratory organs: Secondary | ICD-10-CM

## 2024-02-01 DIAGNOSIS — M542 Cervicalgia: Secondary | ICD-10-CM

## 2024-02-01 DIAGNOSIS — G8929 Other chronic pain: Secondary | ICD-10-CM | POA: Diagnosis not present

## 2024-02-01 DIAGNOSIS — Z1211 Encounter for screening for malignant neoplasm of colon: Secondary | ICD-10-CM

## 2024-02-01 DIAGNOSIS — Z114 Encounter for screening for human immunodeficiency virus [HIV]: Secondary | ICD-10-CM

## 2024-02-01 LAB — COMPREHENSIVE METABOLIC PANEL WITH GFR
ALT: 19 U/L (ref 0–53)
AST: 33 U/L (ref 0–37)
Albumin: 4.6 g/dL (ref 3.5–5.2)
Alkaline Phosphatase: 50 U/L (ref 39–117)
BUN: 8 mg/dL (ref 6–23)
CO2: 28 meq/L (ref 19–32)
Calcium: 9.5 mg/dL (ref 8.4–10.5)
Chloride: 102 meq/L (ref 96–112)
Creatinine, Ser: 1.03 mg/dL (ref 0.40–1.50)
GFR: 79.47 mL/min (ref >60.00)
Glucose, Bld: 79 mg/dL (ref 70–99)
Potassium: 4.2 meq/L (ref 3.5–5.1)
Sodium: 138 meq/L (ref 135–145)
Total Bilirubin: 0.3 mg/dL (ref 0.2–1.2)
Total Protein: 7.4 g/dL (ref 6.0–8.3)

## 2024-02-01 LAB — CBC
HCT: 41 % (ref 39.0–52.0)
Hemoglobin: 13.4 g/dL (ref 13.0–17.0)
MCHC: 32.8 g/dL (ref 30.0–36.0)
MCV: 90.8 fl (ref 78.0–100.0)
Platelets: 280 10*3/uL (ref 150.0–400.0)
RBC: 4.52 Mil/uL (ref 4.22–5.81)
RDW: 13.7 % (ref 11.5–15.5)
WBC: 6.1 10*3/uL (ref 4.0–10.5)

## 2024-02-01 LAB — LIPID PANEL
Cholesterol: 175 mg/dL (ref 0–200)
HDL: 109.2 mg/dL (ref >39.00)
LDL Cholesterol: 55 mg/dL (ref 0–99)
NonHDL: 66.28
Total CHOL/HDL Ratio: 2
Triglycerides: 55 mg/dL (ref 0.0–149.0)
VLDL: 11 mg/dL (ref 0.0–40.0)

## 2024-02-01 LAB — HEMOGLOBIN A1C: Hgb A1c MFr Bld: 6 % (ref 4.6–6.5)

## 2024-02-01 LAB — PSA: PSA: 0.41 ng/mL (ref 0.10–4.00)

## 2024-02-01 LAB — VITAMIN B12: Vitamin B-12: 345 pg/mL (ref 211–911)

## 2024-02-01 LAB — URINALYSIS, MICROSCOPIC ONLY
RBC / HPF: NONE SEEN (ref 0–?)
WBC, UA: NONE SEEN (ref 0–?)

## 2024-02-01 LAB — TSH: TSH: 1.1 u[IU]/mL (ref 0.35–5.50)

## 2024-02-01 NOTE — Assessment & Plan Note (Signed)
 Likely secondary to admit.  Will check basic labs inclusive of CBC, CMP, TSH, B12.  If nondiabetic and labs look okay we will treat patient with a course of steroids to see if this helps

## 2024-02-01 NOTE — Assessment & Plan Note (Signed)
 Pending urine microscopy rule out microscopic hematuria in setting of smoking

## 2024-02-01 NOTE — Assessment & Plan Note (Signed)
 Secondary to cervical fusion.  Pending x-ray today patient will likely need MRI based on exam and length of symptoms
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
## 2024-02-01 NOTE — Assessment & Plan Note (Addendum)
 Likely cervical in nature.  If labs look okay trial muscle relaxer.  Plan to use Flexeril 5 mg twice daily..  I reviewed sedation precautions with patient.  Given patient's age and recent onset of headaches we did get imaging of his head

## 2024-02-01 NOTE — Patient Instructions (Signed)
 Nice to see you today I want to see you in 6 weeks sooner if you need me  I will be in touch with the labs and xray results

## 2024-02-02 LAB — HEPATITIS C ANTIBODY: Hepatitis C Ab: NONREACTIVE

## 2024-02-02 LAB — HIV ANTIBODY (ROUTINE TESTING W REFLEX): HIV 1&2 Ab, 4th Generation: NONREACTIVE

## 2024-02-08 ENCOUNTER — Other Ambulatory Visit: Payer: Self-pay | Admitting: Nurse Practitioner

## 2024-02-08 MED ORDER — CYCLOBENZAPRINE HCL 5 MG PO TABS: 5.0000 mg | ORAL_TABLET | Freq: Two times a day (BID) | ORAL | 0 refills | Status: DC | PRN

## 2024-02-14 ENCOUNTER — Encounter: Payer: Self-pay | Admitting: *Deleted

## 2024-02-15 ENCOUNTER — Telehealth: Payer: Self-pay | Admitting: Acute Care

## 2024-02-15 ENCOUNTER — Other Ambulatory Visit: Payer: Self-pay

## 2024-02-15 DIAGNOSIS — Z87891 Personal history of nicotine dependence: Secondary | ICD-10-CM

## 2024-02-15 DIAGNOSIS — Z122 Encounter for screening for malignant neoplasm of respiratory organs: Secondary | ICD-10-CM

## 2024-02-15 DIAGNOSIS — F17210 Nicotine dependence, cigarettes, uncomplicated: Secondary | ICD-10-CM

## 2024-02-15 NOTE — Telephone Encounter (Signed)
 Lung Cancer Screening Narrative/Criteria Questionnaire (Cigarette Smokers Only- No Cigars/Pipes/vapes)   Ethan Drake   SDMV:03/14/24 at 9am / [facility]                                           [birth year]                         LDCT: 03/15/24 at 930am / [facility]    58 y.o.   Phone: [phone]  Lung Screening Narrative (confirm age 2-77 yrs Medicare / 50-80 yrs Private pay insurance)   Insurance information:Medicaid   Referring Provider:[facility]   This screening involves an initial phone call with a team member from our program. It is called a shared decision making visit. The initial meeting is required by insurance and Medicare to make sure you understand the program. This appointment takes about 15-20 minutes to complete. The CT scan will completed at a separate date/time. This scan takes about 5-10 minutes to complete and you may eat and drink before and after the scan.  Criteria questions for Lung Cancer Screening:   Are you a current or former smoker? Current Age began smoking: 63 y   If you are a former smoker, what year did you quit smoking? NA   To calculate your smoking history, I need an accurate estimate of how many packs of cigarettes you smoked per day and for how many years. (Not just the number of PPD you are now smoking)   Years smoking 45 x Packs per day 1/2 to 1 = Pack years 33   (at least 20 pack yrs)   (Make sure they understand that we need to know how much they have smoked in the past, not just the number of PPD they are smoking now)  Do you have a personal history of cancer?  No    Do you have a family history of cancer? Yes  (cancer type and and relative) mother/breast  Are you coughing up blood?  No  Have you had unexplained weight loss of 15 lbs or more in the last 6 months? No  It looks like you meet all criteria.     Additional information: N/A

## 2024-03-14 ENCOUNTER — Ambulatory Visit: Admitting: *Deleted

## 2024-03-14 ENCOUNTER — Encounter: Payer: Self-pay | Admitting: *Deleted

## 2024-03-14 DIAGNOSIS — F17210 Nicotine dependence, cigarettes, uncomplicated: Secondary | ICD-10-CM

## 2024-03-14 NOTE — Progress Notes (Signed)
  Virtual Visit via Telephone Note  I connected with Tatem Massad on 03/14/24 at  9:00 AM EDT by telephone and verified that I am speaking with the correct person using two identifiers.  Location: Patient: Ethan Drake Provider: Alyse Bach, RN   I discussed the limitations, risks, security and privacy concerns of performing an evaluation and management service by telephone and the availability of in person appointments. I also discussed with the patient that there may be a patient responsible charge related to this service. The patient expressed understanding and agreed to proceed.   Shared Decision Making Visit Lung Cancer Screening Program [phone])   Eligibility: Age 58 y.o. Pack Years Smoking History Calculation 34 (# packs/per year x # years smoked) Recent History of coughing up blood  no Unexplained weight loss? yes ( >Than 15 pounds within the last 6 months ) Prior History Lung / other cancer no (Diagnosis within the last 5 years already requiring surveillance chest CT Scans). Smoking Status Current Smoker Former Smokers: Years since quit: n/a  Quit Date: n/a  Visit Components: Discussion included one or more decision making aids. yes Discussion included risk/benefits of screening. yes Discussion included potential follow up diagnostic testing for abnormal scans. yes Discussion included meaning and risk of over diagnosis. yes Discussion included meaning and risk of False Positives. yes Discussion included meaning of total radiation exposure. yes  Counseling Included: Importance of adherence to annual lung cancer LDCT screening. yes Impact of comorbidities on ability to participate in the program. yes Ability and willingness to under diagnostic treatment. yes  Smoking Cessation Counseling: Current Smokers:  Discussed importance of smoking cessation. yes Information about tobacco cessation classes and interventions provided to patient. yes Patient  provided with "ticket" for LDCT Scan. no Symptomatic Patient. no  Counseling(Intermediate counseling: > three minutes) [id number] Diagnosis Code: Tobacco Use Z72.0 Asymptomatic Patient yes  Counseling (Intermediate counseling: > three minutes counseling) M8413 Former Smokers:  Discussed the importance of maintaining cigarette abstinence. yes Diagnosis Code: Personal History of Nicotine  Dependence. K44.010 Information about tobacco cessation classes and interventions provided to patient. Yes Patient provided with "ticket" for LDCT Scan. no Written Order for Lung Cancer Screening with LDCT placed in Epic. Yes (CT Chest Lung Cancer Screening Low Dose W/O CM) [id number] Z12.2-Screening of respiratory organs Z87.891-Personal history of nicotine  dependence   Alyse Bach, RN

## 2024-03-14 NOTE — Patient Instructions (Signed)

## 2024-03-15 ENCOUNTER — Ambulatory Visit
Admission: RE | Admit: 2024-03-15 | Discharge: 2024-03-15 | Disposition: A | Discharge status: Home / Self Care | Source: Ambulatory Visit | Attending: Acute Care | Admitting: Acute Care

## 2024-03-15 DIAGNOSIS — F17210 Nicotine dependence, cigarettes, uncomplicated: Secondary | ICD-10-CM | POA: Diagnosis present

## 2024-03-15 DIAGNOSIS — Z87891 Personal history of nicotine dependence: Secondary | ICD-10-CM | POA: Diagnosis present

## 2024-03-15 DIAGNOSIS — Z122 Encounter for screening for malignant neoplasm of respiratory organs: Secondary | ICD-10-CM | POA: Insufficient documentation

## 2024-03-20 ENCOUNTER — Ambulatory Visit (INDEPENDENT_AMBULATORY_CARE_PROVIDER_SITE_OTHER): Admitting: Nurse Practitioner

## 2024-03-20 VITALS — BP 120/82 | HR 73 | Temp 98.0°F | Ht 65.85 in | Wt 142.6 lb

## 2024-03-20 DIAGNOSIS — R202 Paresthesia of skin: Secondary | ICD-10-CM

## 2024-03-20 DIAGNOSIS — M542 Cervicalgia: Secondary | ICD-10-CM

## 2024-03-20 DIAGNOSIS — G8929 Other chronic pain: Secondary | ICD-10-CM | POA: Diagnosis not present

## 2024-03-20 DIAGNOSIS — R519 Headache, unspecified: Secondary | ICD-10-CM | POA: Diagnosis not present

## 2024-03-20 MED ORDER — PREDNISONE 20 MG PO TABS: ORAL_TABLET | ORAL | 0 refills | Status: AC

## 2024-03-20 NOTE — Assessment & Plan Note (Signed)
 Patient having a history of chronic neck pain with prior C3-7 fusion.  Did obtain x-ray of cervical spine.  Patient has trialed muscle relaxer without improvement.  Will obtain MRI cervical spine as I do think you have a spinal cord compression

## 2024-03-20 NOTE — Progress Notes (Signed)
 Established Patient Office Visit  Subjective   Patient ID: Ethan Drake, male    DOB: Mar 09, 1966  Age: 58 y.o. MRN: [id number]  Chief Complaint  Patient presents with   Follow-up    Pt complains of sharp pain headaches. Pt states of ongoing neck pain that radiates to his left shoulder. Pt complains that he feels like pain level has not changed in 6 weeks. States medication is not helping.     HPI  Headaches/neck pain: Patient was seen by me on 02/01/2024 at that juncture we did obtain basic blood work And cervical spine imaging.  Did show anterior cervical disc fusion from C3-C7 that was stable from previous imaging.  We did trial patient on Flexeril .  He is here for follow-up Stats that he has tried the [employer] that di d not help. States that he will get them almost daily. States that when he got her today he got one on his way back to the room States that he hastr edi ibuprofen/tylenol  and then he will go to sleep He does not have an aura. It is a sharp pain that will travel up the back of the head to his crown.     Review of Systems  Constitutional:  Negative for chills and fever.  Respiratory:  Positive for shortness of breath.   Cardiovascular:  Negative for chest pain.  Neurological:  Positive for dizziness, tingling, weakness and headaches.  Psychiatric/Behavioral:  Negative for hallucinations and suicidal ideas.       Objective:     BP 120/82   Pulse 73   Temp 98 F (36.7 C) (Oral)   Ht 5' 5.85" (1.673 m)   Wt 142 lb 9.6 oz (64.7 kg)   SpO2 98%   BMI 23.12 kg/m  BP Readings from Last 3 Encounters:  03/20/24 120/82  02/01/24 136/82  09/18/22 120/70   Wt Readings from Last 3 Encounters:  03/20/24 142 lb 9.6 oz (64.7 kg)  03/15/24 150 lb (68 kg)  02/01/24 145 lb 9.6 oz (66 kg)   SpO2 Readings from Last 3 Encounters:  03/20/24 98%  02/01/24 98%  09/18/22 99%      Physical Exam Vitals and nursing note reviewed.  Constitutional:       Appearance: Normal appearance.  Eyes:     Extraocular Movements: Extraocular movements intact.     Pupils: Pupils are equal, round, and reactive to light.  Cardiovascular:     Rate and Rhythm: Normal rate and regular rhythm.     Heart sounds: Normal heart sounds.  Pulmonary:     Effort: Pulmonary effort is normal.     Breath sounds: Normal breath sounds.  Musculoskeletal:        General: Tenderness present.     Cervical back: Tenderness present.  Neurological:     Mental Status: He is alert.     Deep Tendon Reflexes:     Reflex Scores:      Bicep reflexes are 2+ on the right side and 2+ on the left side.      Patellar reflexes are 2+ on the right side and 2+ on the left side.    Comments: Bilateral lower extremity strength 5/5' LUE 4/5 RUE 5/5      No results found for any visits on 03/20/24.    The ASCVD Risk score (Arnett DK, et al., 2019) failed to calculate for the following reasons:   The valid HDL cholesterol range is 20 to 100 mg/dL  Assessment & Plan:   Problem List Items Addressed This Visit       Other   Frequent headaches - Primary   New onset headaches that I do believe are cervicogenic.  Given patient may never 37 with new onset headaches we will obtain MRI brain and MRI cervical spine.  No red flag symptoms in office      Relevant Orders   MR Brain Wo Contrast   Cervicalgia   Patient having a history of chronic neck pain with prior C3-7 fusion.  Did obtain x-ray of cervical spine.  Patient has trialed muscle relaxer without improvement.  Will obtain MRI cervical spine as I do think you have a spinal cord compression      Relevant Medications   predniSONE  (DELTASONE ) 20 MG tablet   Other Relevant Orders   MR Cervical Spine Wo Contrast   Paresthesia   Left-sided will trial patient on steroids.  Does have history of cervical fusion.  Cervical spine x-ray done that was unrevealing.  I do think patient is experiencing some cervical cord compression  will obtain MR cervical spine      Relevant Medications   predniSONE  (DELTASONE ) 20 MG tablet   Other Relevant Orders   MR Brain Wo Contrast   MR Cervical Spine Wo Contrast    Return in about 6 weeks (around 05/01/2024) for Neck/weight recheck .    Margarie Shay, NP

## 2024-03-20 NOTE — Assessment & Plan Note (Signed)
 New onset headaches that I do believe are cervicogenic.  Given patient may never 89 with new onset headaches we will obtain MRI brain and MRI cervical spine.  No red flag symptoms in office

## 2024-03-20 NOTE — Assessment & Plan Note (Signed)
 Left-sided will trial patient on steroids.  Does have history of cervical fusion.  Cervical spine x-ray done that was unrevealing.  I do think patient is experiencing some cervical cord compression will obtain MR cervical spine

## 2024-03-20 NOTE — Patient Instructions (Signed)
 Nice to see you today They will reach out to you and schedule the MRI Follow up with  me in 6 weeks  Avoid NSAIDS like Ibuprofen, Motrin, Aleve, Naproxen, BC/Goody powders while on prednisone 

## 2024-04-05 ENCOUNTER — Other Ambulatory Visit: Payer: Self-pay

## 2024-04-05 DIAGNOSIS — F17210 Nicotine dependence, cigarettes, uncomplicated: Secondary | ICD-10-CM

## 2024-04-05 DIAGNOSIS — Z87891 Personal history of nicotine dependence: Secondary | ICD-10-CM

## 2024-04-05 DIAGNOSIS — Z122 Encounter for screening for malignant neoplasm of respiratory organs: Secondary | ICD-10-CM

## 2024-05-01 ENCOUNTER — Ambulatory Visit (INDEPENDENT_AMBULATORY_CARE_PROVIDER_SITE_OTHER): Admitting: Nurse Practitioner

## 2024-05-01 ENCOUNTER — Telehealth: Payer: Self-pay | Admitting: Nurse Practitioner

## 2024-05-01 VITALS — BP 118/70 | HR 79 | Temp 98.2°F | Ht 65.85 in | Wt 138.6 lb

## 2024-05-01 DIAGNOSIS — M542 Cervicalgia: Secondary | ICD-10-CM

## 2024-05-01 DIAGNOSIS — G44221 Chronic tension-type headache, intractable: Secondary | ICD-10-CM | POA: Diagnosis not present

## 2024-05-01 DIAGNOSIS — R519 Headache, unspecified: Secondary | ICD-10-CM

## 2024-05-01 DIAGNOSIS — R634 Abnormal weight loss: Secondary | ICD-10-CM | POA: Diagnosis not present

## 2024-05-01 DIAGNOSIS — R202 Paresthesia of skin: Secondary | ICD-10-CM

## 2024-05-01 MED ORDER — BUTALBITAL-APAP-CAFFEINE 50-325-40 MG PO TABS
1.0000 | ORAL_TABLET | Freq: Four times a day (QID) | ORAL | 0 refills | Status: AC | PRN
Start: 2024-05-01 — End: ?

## 2024-05-01 NOTE — Patient Instructions (Signed)
 Nice to see you today I will reach out to you next week to see how the medicine is doing If you do not hear about the MRIs by next week please let me know  Follow up with me in 1 month, sooner if you need me

## 2024-05-01 NOTE — Assessment & Plan Note (Signed)
 Patient has tried and failed muscle relaxer and prednisone .  Will try Fioricet every 6 hours as needed.  Sedation precautions reviewed.  Did discuss with patient about allergy to Vicodin.  Patient has taken Tylenol  over-the-counter without difficulty

## 2024-05-01 NOTE — Progress Notes (Signed)
 Established Patient Office Visit  Subjective   Patient ID: Ethan Drake, male    DOB: 03/03/1966  Age: 58 y.o. MRN: [id number]  Chief Complaint  Patient presents with   Follow-up    Pt complains of constant severe headaches that are getting worse. Pt states  Pain level 5-6 today. States headaches last all day and are excruciating for the past few weeks. Last weight 142. Today is 138.    Neck Pain    Pt complains of neck hurting when turning head. State of neck popping.     HPI  Headache/neck pain: Patient seen by me on 4 1/5 and 03/20/2024.  At the first office visit patient was having neck pain with a history of cervical fusion in 2018 at several levels.  But then started getting headaches that would make him cry.  He does have numbness in the left upper extremity with weakness.  He described the headaches every day and the pain was described as sharp and stabbing.  Had tried some over-the-counter analgesics with minimal relief.  These would not abate the headache.  These have been going on at that juncture for 6 months.  We did obtain a cervical spine x-ray at that point that showed mature anterior cervical disc fusion from C3-C7.  Patient was reevaluated by me on 03/20/2024.  At that juncture we did try treating with a prednisone  taper to see if this would help and ordered an MRI cervical spine and MRI brain.  He is here today for recheck  States that he is eatign 2 times a day sometimes. States that he will not eat anything due to the pain in his head and neck. States that if he can get to sleep it will help with the pain. States that 1-2 times on average with his meals.  He does drink water. Not much soda. States that eh will have a beer on occassio    Review of Systems  Constitutional:  Negative for chills and fever.  Respiratory:  Negative for shortness of breath.   Cardiovascular:  Negative for chest pain.  Musculoskeletal:  Positive for neck pain.  Neurological:  Positive  for tingling, weakness and headaches.      Objective:     BP 118/70   Pulse 79   Temp 98.2 F (36.8 C) (Oral)   Ht 5' 5.85 (1.673 m)   Wt 138 lb 9.6 oz (62.9 kg)   SpO2 97%   BMI 22.47 kg/m  BP Readings from Last 3 Encounters:  05/01/24 118/70  03/20/24 120/82  02/01/24 136/82   Wt Readings from Last 3 Encounters:  05/01/24 138 lb 9.6 oz (62.9 kg)  03/20/24 142 lb 9.6 oz (64.7 kg)  03/15/24 150 lb (68 kg)   SpO2 Readings from Last 3 Encounters:  05/01/24 97%  03/20/24 98%  02/01/24 98%      Physical Exam Vitals and nursing note reviewed.  Constitutional:      Appearance: Normal appearance.  HENT:     Mouth/Throat:     Mouth: Mucous membranes are moist.     Pharynx: Oropharynx is clear.   Eyes:     Extraocular Movements: Extraocular movements intact.     Pupils: Pupils are equal, round, and reactive to light.    Cardiovascular:     Rate and Rhythm: Normal rate and regular rhythm.     Heart sounds: Normal heart sounds.  Pulmonary:     Effort: Pulmonary effort is normal.     Breath  sounds: Normal breath sounds.   Neurological:     Mental Status: He is alert. Mental status is at baseline.     Cranial Nerves: Cranial nerves 2-12 are intact.     Sensory: Sensory deficit present.     Motor: Weakness present.     Gait: Gait is intact.     Deep Tendon Reflexes:     Reflex Scores:      Bicep reflexes are 1+ on the right side and 1+ on the left side.      Patellar reflexes are 2+ on the right side and 2+ on the left side.    Comments: LUE 3/5 strength RUE 5/5  Bilateral lower extremity strength 5/5  Altered sensation on left side body     No results found for any visits on 05/01/24.    The ASCVD Risk score (Arnett DK, et al., 2019) failed to calculate for the following reasons:   The valid HDL cholesterol range is 20 to 100 mg/dL    Assessment & Plan:   Problem List Items Addressed This Visit       Other   Frequent headaches   Patient has  tried and failed muscle relaxer and prednisone .  Will try Fioricet every 6 hours as needed.  Sedation precautions reviewed.  Did discuss with patient about allergy to Vicodin.  Patient has taken Tylenol  over-the-counter without difficulty      Relevant Medications   butalbital-acetaminophen -caffeine (FIORICET) 50-325-40 MG tablet   Cervicalgia - Primary   History of cervical spine surgery with x-ray done pending MRI.      Paresthesia   Improved somewhat with use of steroids.  But still present.  Patient still having left upper extremity weakness.  Pending MRI brain and cervical spine      Chronic tension-type headache, intractable   Tried and failed muscle relaxer and prednisone .  Will try Fioricet      Relevant Medications   butalbital-acetaminophen -caffeine (FIORICET) 50-325-40 MG tablet   Unintentional weight loss   Secondary to pain per patient report.  Eating 1-2 meals a day.  Encouraged to get 2 meals a day.  Patient underwent low-dose CT scan that showed nodule but no concerning finding.       Return in about 4 weeks (around 05/29/2024) for Headache and weight recheck .    Adina Crandall, NP

## 2024-05-01 NOTE — Assessment & Plan Note (Signed)
 Secondary to pain per patient report.  Eating 1-2 meals a day.  Encouraged to get 2 meals a day.  Patient underwent low-dose CT scan that showed nodule but no concerning finding.

## 2024-05-01 NOTE — Assessment & Plan Note (Signed)
 Improved somewhat with use of steroids.  But still present.  Patient still having left upper extremity weakness.  Pending MRI brain and cervical spine

## 2024-05-01 NOTE — Assessment & Plan Note (Signed)
 Tried and failed muscle relaxer and prednisone .  Will try Fioricet

## 2024-05-01 NOTE — Assessment & Plan Note (Signed)
 History of cervical spine surgery with x-ray done pending MRI.

## 2024-05-01 NOTE — Telephone Encounter (Signed)
 Can we check the status on MRI cervical spine and MRI brain please?

## 2024-05-08 ENCOUNTER — Telehealth: Payer: Self-pay | Admitting: Nurse Practitioner

## 2024-05-08 NOTE — Telephone Encounter (Signed)
 Copied from CRM [id number]. Topic: Clinical - Request for Lab/Test Order >> May 08, 2024 11:40 AM Franky GRADE wrote: Reason for CRM: Patient's daughter [employer] is calling because MRI's were ordered and patient was informed that if they had not heard back by today to call the office to assist with scheduling.   Reached out to [facility] they will follow up and let us  know. Holding until received update

## 2024-05-08 NOTE — Telephone Encounter (Signed)
 Can we see if the Fioricet has helped with the headaches  Have they heard anything about the MRI of the head and neck?

## 2024-05-08 NOTE — Telephone Encounter (Signed)
-----   Message from [facility] sent at 05/01/2024  8:43 AM EDT ----- Regarding: headahce See if fioricet has helped headaches

## 2024-05-08 NOTE — Telephone Encounter (Signed)
 See 05/08/24 message.

## 2024-05-08 NOTE — Telephone Encounter (Signed)
 Called pt.  Pt states that fioricet has been helping a little. States this medication is better than the last but is still having headaches. Pt is taking PRN but not every 6 hours.   Pt has not heard from MRI in regards to scheduling.

## 2024-05-08 NOTE — Telephone Encounter (Signed)
 Can we please check on the MRI status. I have messaged [facility] on 05/01/2024 with a high priority message. I have not gotten a response the MRI have been in over 6 weeks at this point

## 2024-05-08 NOTE — Telephone Encounter (Signed)
 I am not sure what happened to the message from 05/01/2024 as I was on vacation when that was sent.  I do see it was closed out but not addressed, may have been closed out by accident.   I have looked into these two scans.   They are pending approval with insurance. Clinicals requested by insurance in order to approve this scan. Clinicals uploaded.   Patient has only been treated with medications as part of conservative treatment; no PT or home exercises prescribed. Pt has previous Xray but no recent CT.  Clinical/Medical Provider review with insurance is required prior to approving this scan.  Waiting on further information or approval from insurance.   I will check again tomorrow and see if these have been approved.

## 2024-05-11 NOTE — Telephone Encounter (Unsigned)
 Copied from CRM [id number]. Topic: Clinical - Request for Lab/Test Order >> May 08, 2024 11:40 AM Franky GRADE wrote: Reason for CRM: Patient's daughter [employer] is calling because MRI's were ordered and patient was informed that if they had not heard back by today to call the office to assist with scheduling. >> May 11, 2024  3:18 PM Franky GRADE wrote: Patient's daughter is calling to verify if they should keep the appointment for 05/31/2024 with Lynwood Crandall even if the patient has not had the MRI done as of yet.

## 2024-05-11 NOTE — Telephone Encounter (Signed)
 Keep the appointment with me as scheduled for now. Can we relay the information that Ashtyn gave   Ashtyn and updates?

## 2024-05-12 NOTE — Telephone Encounter (Signed)
 Can we call the patient and his family with this information please

## 2024-05-12 NOTE — Telephone Encounter (Signed)
 Called pt daughter [employer]jo and informed her of MRI approval and gave her the phone number to [facility] to call and schedule the pt's MRI.   She verbalized understanding and has no questions or concerns that this time.

## 2024-05-18 ENCOUNTER — Ambulatory Visit
Admission: RE | Admit: 2024-05-18 | Discharge: 2024-05-18 | Disposition: A | Discharge status: Home / Self Care | Source: Ambulatory Visit | Attending: Nurse Practitioner | Admitting: Nurse Practitioner

## 2024-05-18 DIAGNOSIS — R202 Paresthesia of skin: Secondary | ICD-10-CM | POA: Insufficient documentation

## 2024-05-18 DIAGNOSIS — R519 Headache, unspecified: Secondary | ICD-10-CM

## 2024-05-18 DIAGNOSIS — M542 Cervicalgia: Secondary | ICD-10-CM | POA: Diagnosis present

## 2024-05-23 ENCOUNTER — Ambulatory Visit: Payer: Self-pay | Admitting: Nurse Practitioner

## 2024-05-23 DIAGNOSIS — R29898 Other symptoms and signs involving the musculoskeletal system: Secondary | ICD-10-CM

## 2024-05-23 DIAGNOSIS — R202 Paresthesia of skin: Secondary | ICD-10-CM

## 2024-05-23 DIAGNOSIS — M4802 Spinal stenosis, cervical region: Secondary | ICD-10-CM

## 2024-05-23 DIAGNOSIS — G8929 Other chronic pain: Secondary | ICD-10-CM

## 2024-05-31 ENCOUNTER — Ambulatory Visit (INDEPENDENT_AMBULATORY_CARE_PROVIDER_SITE_OTHER): Admitting: Nurse Practitioner

## 2024-05-31 VITALS — BP 112/62 | HR 65 | Temp 98.3°F | Ht 65.85 in | Wt 141.0 lb

## 2024-05-31 DIAGNOSIS — R0789 Other chest pain: Secondary | ICD-10-CM | POA: Diagnosis not present

## 2024-05-31 DIAGNOSIS — G959 Disease of spinal cord, unspecified: Secondary | ICD-10-CM

## 2024-05-31 DIAGNOSIS — M542 Cervicalgia: Secondary | ICD-10-CM

## 2024-05-31 DIAGNOSIS — G44221 Chronic tension-type headache, intractable: Secondary | ICD-10-CM | POA: Diagnosis not present

## 2024-05-31 NOTE — Progress Notes (Signed)
 Established Patient Office Visit  Subjective   Patient ID: Ethan Drake, male    DOB: 1965/12/29  Age: 58 y.o. MRN: [id number]  Chief Complaint  Patient presents with   Follow-up    Pt complains that some days his head will not hurt but his neck will still bother him.     HPI  Neck pain/headache: Patient has been by me several times for chronic neck pain and left like is a cervicogenic headache.  Patient has tried muscle relaxers without great relief.  We did try steroids without good relief is also tried Fioricet with some relief.  Patient did undergo MRI of cervical spine and brain. MR brain was no evidence of acute intracranial abnormality with a mild multifocal T2 flair hyperintense signal within the cerebral white matter likely secondary to chronic small vessel ischemia.  Also showed possible sinus atelectasis.  MR cervical spine did show prior ACDF at C3-C4 through C6-C7.  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  Patient was referred to neurosurgery on 05/23/2024.  He has an appointment on 06/19/2024 he is here for follow-up  Statse taht he can feel it in his neck and worse with turning his neck. States that he is getting headahces daily but not as bad as it has been. He has tried fioricet and he states that it helps him go to sleep. He is taking them as needed. He has taken 2 this week so   Chest pain: states that he work up and it was hurting him. States that it happened on Saturday that was aching in the chest and it was going into his left arm and went down into his leg. States that he is now sore. States that it lasted approx 90-1`20 mins. Statse that deep breaths did not hurt.     Review of Systems  Constitutional:  Negative for chills and fever.  Respiratory:  Negative for shortness of breath.   Cardiovascular:  Positive for  chest pain.  Musculoskeletal:  Positive for neck pain.  Neurological:  Positive for headaches.      Objective:     BP 112/62   Pulse 65   Temp 98.3 F (36.8 C) (Oral)   Ht 5' 5.85 (1.673 m)   Wt 141 lb (64 kg)   SpO2 96%   BMI 22.86 kg/m  BP Readings from Last 3 Encounters:  05/31/24 112/62  05/01/24 118/70  03/20/24 120/82   Wt Readings from Last 3 Encounters:  05/31/24 141 lb (64 kg)  05/01/24 138 lb 9.6 oz (62.9 kg)  03/20/24 142 lb 9.6 oz (64.7 kg)   SpO2 Readings from Last 3 Encounters:  05/31/24 96%  05/01/24 97%  03/20/24 98%      Physical Exam Vitals and nursing note reviewed.  Constitutional:      Appearance: Normal appearance.  Cardiovascular:     Rate and Rhythm: Normal rate and regular rhythm.     Heart sounds: Normal heart sounds.  Pulmonary:     Effort: Pulmonary effort is normal.     Breath sounds: Normal breath sounds.  Chest:     Chest wall: Tenderness present.  Musculoskeletal:     Lumbar back: Positive left straight leg raise test. Negative right straight leg raise test.  Neurological:     General: No focal deficit present.     Mental Status:  He is alert.     Deep Tendon Reflexes:     Reflex Scores:      Bicep reflexes are 2+ on the right side and 2+ on the left side.      Patellar reflexes are 3+ on the right side and 3+ on the left side.    Comments: Bilateral upper and lower extremity strength 5/5      No results found for any visits on 05/31/24.    The ASCVD Risk score (Arnett DK, et al., 2019) failed to calculate for the following reasons:   The valid HDL cholesterol range is 20 to 100 mg/dL    Assessment & Plan:   Problem List Items Addressed This Visit       Nervous and Auditory   Cervical myelopathy (HCC) - Primary   History of the same.  Patient is established with neurosurgery has an appointment in approximately 2 weeks.  Neuroexam stable in office        Other   Cervicalgia   Still having some neck  discomfort pain we have tried steroids, muscle relaxers.  Patient has an appoint with neurosurgery maybe a candidate for injections.  Pending neurosurgery consult      Chronic tension-type headache, intractable   Patient currently using Fioricet as needed with some benefit.  Continue using medication as prescribed      Chest wall pain   No clear injury.  Patient pain is reproducible in office.  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  Reduced activity Tylenol  over-the-counter as needed.       Return in about 8 months (around 01/29/2025) for CPE and Labs.    Adina Crandall, NP

## 2024-05-31 NOTE — Patient Instructions (Addendum)
 Nice to see you today Continue taking the headache medication as needed Keep the appointment with neurosurgery as scheduled Follow up with me in approx 8 months for your physical

## 2024-05-31 NOTE — Assessment & Plan Note (Signed)
 No clear injury.  Patient pain is reproducible in office.  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  Reduced activity Tylenol  over-the-counter as needed.

## 2024-05-31 NOTE — Assessment & Plan Note (Signed)
 Patient currently using Fioricet as needed with some benefit.  Continue using medication as prescribed

## 2024-05-31 NOTE — Assessment & Plan Note (Signed)
 History of the same.  Patient is established with neurosurgery has an appointment in approximately 2 weeks.  Neuroexam stable in office

## 2024-05-31 NOTE — Assessment & Plan Note (Signed)
 Still having some neck discomfort pain we have tried steroids, muscle relaxers.  Patient has an appoint with neurosurgery maybe a candidate for injections.  Pending neurosurgery consult
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
## 2024-06-19 ENCOUNTER — Ambulatory Visit
Admission: RE | Admit: 2024-06-19 | Discharge: 2024-06-19 | Disposition: A | Discharge status: Home / Self Care | Source: Ambulatory Visit | Attending: Physician Assistant | Admitting: Physician Assistant

## 2024-06-19 ENCOUNTER — Ambulatory Visit
Admission: RE | Admit: 2024-06-19 | Discharge: 2024-06-19 | Disposition: A | Discharge status: Home / Self Care | Attending: Physician Assistant | Admitting: Physician Assistant

## 2024-06-19 ENCOUNTER — Encounter: Payer: Self-pay | Admitting: Physician Assistant

## 2024-06-19 ENCOUNTER — Ambulatory Visit (INDEPENDENT_AMBULATORY_CARE_PROVIDER_SITE_OTHER): Admitting: Physician Assistant

## 2024-06-19 VITALS — BP 132/78 | Ht 65.85 in | Wt 139.0 lb

## 2024-06-19 DIAGNOSIS — M4802 Spinal stenosis, cervical region: Secondary | ICD-10-CM | POA: Diagnosis not present

## 2024-06-19 DIAGNOSIS — R202 Paresthesia of skin: Secondary | ICD-10-CM

## 2024-06-19 DIAGNOSIS — M6258 Muscle wasting and atrophy, not elsewhere classified, other site: Secondary | ICD-10-CM | POA: Diagnosis present

## 2024-06-19 DIAGNOSIS — G8929 Other chronic pain: Secondary | ICD-10-CM | POA: Insufficient documentation

## 2024-06-19 DIAGNOSIS — R200 Anesthesia of skin: Secondary | ICD-10-CM | POA: Diagnosis present

## 2024-06-19 DIAGNOSIS — M25512 Pain in left shoulder: Secondary | ICD-10-CM | POA: Diagnosis present

## 2024-06-19 DIAGNOSIS — G959 Disease of spinal cord, unspecified: Secondary | ICD-10-CM

## 2024-06-19 DIAGNOSIS — G9589 Other specified diseases of spinal cord: Secondary | ICD-10-CM | POA: Diagnosis not present

## 2024-06-19 MED ORDER — GABAPENTIN 300 MG PO CAPS: 300.0000 mg | ORAL_CAPSULE | Freq: Three times a day (TID) | ORAL | 2 refills | Status: AC

## 2024-06-20 ENCOUNTER — Encounter: Payer: Self-pay | Admitting: Neurology

## 2024-06-20 ENCOUNTER — Other Ambulatory Visit: Payer: Self-pay

## 2024-06-20 DIAGNOSIS — R202 Paresthesia of skin: Secondary | ICD-10-CM
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
## 2024-06-29 ENCOUNTER — Ambulatory Visit (INDEPENDENT_AMBULATORY_CARE_PROVIDER_SITE_OTHER): Admitting: Neurosurgery

## 2024-06-29 ENCOUNTER — Encounter: Payer: Self-pay | Admitting: Neurosurgery

## 2024-06-29 VITALS — BP 124/72 | Ht 65.85 in | Wt 139.0 lb

## 2024-06-29 DIAGNOSIS — G959 Disease of spinal cord, unspecified: Secondary | ICD-10-CM | POA: Diagnosis not present

## 2024-06-29 DIAGNOSIS — M5412 Radiculopathy, cervical region: Secondary | ICD-10-CM | POA: Diagnosis not present

## 2024-06-29 DIAGNOSIS — M6258 Muscle wasting and atrophy, not elsewhere classified, other site: Secondary | ICD-10-CM | POA: Diagnosis not present

## 2024-06-29 DIAGNOSIS — G8929 Other chronic pain: Secondary | ICD-10-CM

## 2024-06-29 DIAGNOSIS — M4802 Spinal stenosis, cervical region: Secondary | ICD-10-CM

## 2024-08-10 ENCOUNTER — Encounter: Admitting: Neurology

## 2024-08-16 ENCOUNTER — Telehealth: Payer: Self-pay

## 2024-08-16 NOTE — Telephone Encounter (Signed)
 Contacted pt brother to inform him about appt & next steps. Pt brother informed us  he will let him know to call us .

## 2024-08-17 ENCOUNTER — Ambulatory Visit: Admitting: Neurosurgery

## 2025-02-07 ENCOUNTER — Encounter: Admitting: Nurse Practitioner
